# Patient Record
Sex: Female | Born: 1965 | Race: Black or African American | Hispanic: No | Marital: Married | Smoking: Current some day smoker
Health system: Southern US, Community
[De-identification: ages and names within clinical notes are randomized; demographics above are authoritative.]

## PROBLEM LIST (undated history)

## (undated) DIAGNOSIS — I10 Essential (primary) hypertension: Secondary | ICD-10-CM

## (undated) DIAGNOSIS — E785 Hyperlipidemia, unspecified: Secondary | ICD-10-CM

## (undated) DIAGNOSIS — F419 Anxiety disorder, unspecified: Secondary | ICD-10-CM

## (undated) DIAGNOSIS — J40 Bronchitis, not specified as acute or chronic: Secondary | ICD-10-CM

## (undated) DIAGNOSIS — F32A Depression, unspecified: Secondary | ICD-10-CM

## (undated) DIAGNOSIS — M1990 Unspecified osteoarthritis, unspecified site: Secondary | ICD-10-CM

## (undated) DIAGNOSIS — J302 Other seasonal allergic rhinitis: Secondary | ICD-10-CM

## (undated) HISTORY — DX: Depression, unspecified: F32.A

## (undated) HISTORY — PX: BREAST SURGERY: SHX581

## (undated) HISTORY — DX: Anxiety disorder, unspecified: F41.9

## (undated) HISTORY — DX: Other seasonal allergic rhinitis: J30.2

## (undated) HISTORY — DX: Hyperlipidemia, unspecified: E78.5

## (undated) HISTORY — PX: BREAST CYST EXCISION: SHX579

## (undated) HISTORY — DX: Unspecified osteoarthritis, unspecified site: M19.90

## (undated) HISTORY — PX: TUBAL LIGATION: SHX77

---

## 2008-06-30 ENCOUNTER — Emergency Department (HOSPITAL_COMMUNITY): Admission: EM | Admit: 2008-06-30 | Discharge: 2008-06-30 | Payer: Self-pay | Admitting: Emergency Medicine

## 2010-01-02 ENCOUNTER — Emergency Department (HOSPITAL_COMMUNITY)
Admission: EM | Admit: 2010-01-02 | Discharge: 2010-01-02 | Payer: Self-pay | Source: Home / Self Care | Admitting: Emergency Medicine

## 2010-06-19 LAB — POCT CARDIAC MARKERS
CKMB, poc: <1 ng/mL — ABNORMAL LOW (ref 1.0–8.0)
CKMB, poc: <1 ng/mL — ABNORMAL LOW (ref 1.0–8.0)
Myoglobin, poc: 47.3 ng/mL (ref 12–200)
Myoglobin, poc: 57.6 ng/mL (ref 12–200)
Troponin i, poc: <0.05 ng/mL (ref 0.00–0.09)

## 2010-06-19 LAB — POCT I-STAT, CHEM 8
BUN: 7 mg/dL (ref 6–23)
Chloride: 107 mEq/L (ref 96–112)
Creatinine, Ser: 0.7 mg/dL (ref 0.4–1.2)
Potassium: 3.6 mEq/L (ref 3.5–5.1)
Sodium: 137 mEq/L (ref 135–145)
TCO2: 21 mmol/L (ref 0–100)

## 2010-06-19 LAB — GC/CHLAMYDIA PROBE AMP, GENITAL: GC Probe Amp, Genital: NEGATIVE

## 2010-06-19 LAB — URINALYSIS, ROUTINE W REFLEX MICROSCOPIC
Glucose, UA: NEGATIVE mg/dL
Ketones, ur: 15 mg/dL — AB
Protein, ur: NEGATIVE mg/dL
Urobilinogen, UA: 1 mg/dL (ref 0.0–1.0)

## 2010-06-19 LAB — URINE CULTURE: Colony Count: >=100000

## 2010-06-19 LAB — POCT PREGNANCY, URINE: Preg Test, Ur: NEGATIVE

## 2010-06-19 LAB — URINE MICROSCOPIC-ADD ON

## 2010-06-19 LAB — WET PREP, GENITAL: Trich, Wet Prep: NONE SEEN

## 2010-09-09 ENCOUNTER — Emergency Department (HOSPITAL_COMMUNITY)
Admission: EM | Admit: 2010-09-09 | Discharge: 2010-09-09 | Disposition: A | Discharge status: Home / Self Care | Payer: Self-pay | Attending: Emergency Medicine | Admitting: Emergency Medicine

## 2010-09-09 ENCOUNTER — Emergency Department (HOSPITAL_COMMUNITY): Payer: Self-pay

## 2010-09-09 DIAGNOSIS — F17200 Nicotine dependence, unspecified, uncomplicated: Secondary | ICD-10-CM | POA: Insufficient documentation

## 2010-09-09 DIAGNOSIS — J45909 Unspecified asthma, uncomplicated: Secondary | ICD-10-CM | POA: Insufficient documentation

## 2010-09-09 DIAGNOSIS — R0789 Other chest pain: Secondary | ICD-10-CM | POA: Insufficient documentation

## 2010-12-04 ENCOUNTER — Emergency Department (HOSPITAL_COMMUNITY)
Admission: EM | Admit: 2010-12-04 | Discharge: 2010-12-04 | Disposition: A | Discharge status: Home / Self Care | Payer: Self-pay | Attending: Emergency Medicine | Admitting: Emergency Medicine

## 2010-12-04 DIAGNOSIS — K089 Disorder of teeth and supporting structures, unspecified: Secondary | ICD-10-CM | POA: Insufficient documentation

## 2010-12-04 DIAGNOSIS — J45909 Unspecified asthma, uncomplicated: Secondary | ICD-10-CM | POA: Insufficient documentation

## 2010-12-04 DIAGNOSIS — R221 Localized swelling, mass and lump, neck: Secondary | ICD-10-CM | POA: Insufficient documentation

## 2010-12-04 DIAGNOSIS — K047 Periapical abscess without sinus: Secondary | ICD-10-CM | POA: Insufficient documentation

## 2010-12-04 DIAGNOSIS — R599 Enlarged lymph nodes, unspecified: Secondary | ICD-10-CM | POA: Insufficient documentation

## 2010-12-04 DIAGNOSIS — R220 Localized swelling, mass and lump, head: Secondary | ICD-10-CM | POA: Insufficient documentation

## 2011-01-17 ENCOUNTER — Emergency Department (INDEPENDENT_AMBULATORY_CARE_PROVIDER_SITE_OTHER)
Admission: EM | Admit: 2011-01-17 | Discharge: 2011-01-17 | Disposition: A | Discharge status: Home / Self Care | Payer: Self-pay | Source: Home / Self Care

## 2011-01-17 ENCOUNTER — Encounter: Payer: Self-pay | Admitting: Emergency Medicine

## 2011-01-17 DIAGNOSIS — N644 Mastodynia: Secondary | ICD-10-CM

## 2011-01-17 DIAGNOSIS — N61 Mastitis without abscess: Secondary | ICD-10-CM

## 2011-01-17 MED ORDER — HYDROCODONE-ACETAMINOPHEN 5-325 MG PO TABS: 2.0000 | ORAL_TABLET | Freq: Four times a day (QID) | ORAL | Status: AC | PRN

## 2011-01-17 MED ORDER — IBUPROFEN 600 MG PO TABS: 600.0000 mg | ORAL_TABLET | Freq: Four times a day (QID) | ORAL | Status: AC | PRN

## 2011-01-17 MED ORDER — CEPHALEXIN 500 MG PO CAPS: 500.0000 mg | ORAL_CAPSULE | Freq: Four times a day (QID) | ORAL | Status: AC

## 2011-01-17 NOTE — ED Notes (Signed)
Pt here with c/o x 2 cysts to left breast with radiating pain under breast that has been ongoing since December 2011.pt was seen and diag [facility] but states sx resolved and restarted x 2wks ago.reports constant,achy pains.denies fever,chills,n,v

## 2011-01-17 NOTE — ED Provider Notes (Signed)
History     CSN: [id number] Arrival date & time: 01/17/2011  5:07 PM   First MD Initiated Contact with Patient 01/17/11 1718      Chief Complaint  Patient presents with  . Cyst    (Consider location/radiation/quality/duration/timing/severity/associated sxs/prior treatment) Patient is a 45 y.o. female presenting with chest pain. The history is provided by the patient. No language interpreter was used.  Chest Pain The chest pain began 1 - 2 weeks ago. Chest pain occurs intermittently. The chest pain is unchanged. The quality of the pain is described as similar to previous episodes. The pain does not radiate. Pertinent negatives for primary symptoms include no fever, no fatigue, no shortness of breath, no cough, no wheezing, no abdominal pain, no nausea and no vomiting. She tried NSAIDs and aspirin for the symptoms.   pt with sharp left breast pain lasting hours starting today. Similar sx over past 2 weeks but state they usually resolve on own. Reports redness, swelling at nipple earlier today. no nipple d/c, skin changes, fevers. No unintentional weight loss. No trauma to breast. Similar sx over past year, was dx'd with breast cysts last year on mammogram at breast center in [place] and was supposed to have them "drained" but was lost to follow up. No FH breast CA.   Past Medical History  Diagnosis Date  . Asthma     History reviewed. No pertinent past surgical history.  No family history on file.  History  Substance Use Topics  . Smoking status: Current Everyday Smoker  . Smokeless tobacco: Not on file  . Alcohol Use: Yes    OB History    Grav Para Term Preterm Abortions TAB SAB Ect Mult Living                  Review of Systems  Constitutional: Negative for fever, fatigue and unexpected weight change.  Respiratory: Negative for cough, shortness of breath and wheezing.   Gastrointestinal: Negative for nausea, vomiting and abdominal pain.    Allergies  Review of  patient's allergies indicates no known allergies.  Home Medications  No current outpatient prescriptions on file.  BP 164/79  Pulse 80  Temp(Src) 98.6 F (37 C) (Oral)  Resp 18  SpO2 100%  Physical Exam  Nursing note and vitals reviewed. Constitutional: She is oriented to person, place, and time. She appears well-developed and well-nourished. No distress.  HENT:  Head: Normocephalic and atraumatic.  Eyes: EOM are normal. Pupils are equal, round, and reactive to light.  Neck: Normal range of motion.  Cardiovascular: Regular rhythm.   Pulmonary/Chest: Effort normal and breath sounds normal. Left breast exhibits mass and tenderness. Left breast exhibits no inverted nipple, no nipple discharge and no skin change. Breasts are symmetrical.       Tenderness, Redness around nipple,. No expressable d/c. No skin changes. Small tender mobile mass in 3 oclock region near chest wall. No obvious swelling.   Abdominal: She exhibits no distension.  Musculoskeletal: Normal range of motion.  Lymphadenopathy:    She has no cervical adenopathy.    She has no axillary adenopathy.  Neurological: She is alert and oriented to person, place, and time.  Skin: Skin is warm and dry. No rash noted.  Psychiatric: She has a normal mood and affect. Her behavior is normal. Judgment and thought content normal.    ED Course  Procedures (including critical care time)  Labs Reviewed - No data to display No results found.   No diagnosis  found.    MDM  Appears to have early mastitis at nipple. Also with tender smooth mobile mass suggestive of cyst in are that pt states has had cyst before. Will do pain control, nsaids, warm compresses, keflex. Pt with established MD for breast care. Advised close follow up for re-imaging and possible biospy        Danella Maiers [facility] 01/17/11 [phone]

## 2011-08-06 ENCOUNTER — Emergency Department (HOSPITAL_COMMUNITY)
Admission: EM | Admit: 2011-08-06 | Discharge: 2011-08-06 | Disposition: A | Discharge status: Home / Self Care | Payer: Self-pay | Attending: Emergency Medicine | Admitting: Emergency Medicine

## 2011-08-06 ENCOUNTER — Encounter (HOSPITAL_COMMUNITY): Payer: Self-pay | Admitting: *Deleted

## 2011-08-06 DIAGNOSIS — K044 Acute apical periodontitis of pulpal origin: Secondary | ICD-10-CM | POA: Insufficient documentation

## 2011-08-06 DIAGNOSIS — J45909 Unspecified asthma, uncomplicated: Secondary | ICD-10-CM | POA: Insufficient documentation

## 2011-08-06 DIAGNOSIS — K047 Periapical abscess without sinus: Secondary | ICD-10-CM

## 2011-08-06 DIAGNOSIS — F17200 Nicotine dependence, unspecified, uncomplicated: Secondary | ICD-10-CM | POA: Insufficient documentation

## 2011-08-06 MED ORDER — PENICILLIN V POTASSIUM 500 MG PO TABS: 500.0000 mg | ORAL_TABLET | Freq: Four times a day (QID) | ORAL | Status: AC

## 2011-08-06 MED ORDER — HYDROCODONE-ACETAMINOPHEN 5-325 MG PO TABS: 1.0000 | ORAL_TABLET | ORAL | Status: AC | PRN

## 2011-08-06 NOTE — Discharge Instructions (Signed)
Dental Care and Dentist Visits   Dental care supports good overall health. Regular dental visits can also help you avoid dental pain, bleeding, infection, and other more serious health problems in the future. It is important to keep the mouth healthy because diseases in the teeth, gums, and other oral tissues can spread to other areas of the body. Some problems, such as diabetes, heart disease, and pre-term labor have been associated with poor oral health.   See your dentist every 6 months. If you experience emergency problems such as a toothache or broken tooth, go to the dentist right away. If you see your dentist regularly, you may catch problems early. It is easier to be treated for problems in the early stages.   WHAT TO EXPECT AT A DENTIST VISIT   Your dentist will look for many common oral health problems and recommend proper treatment. At your regular dental visit, you can expect:   Gentle cleaning of the teeth and gums. This includes scraping and polishing. This helps to remove the sticky substance around the teeth and gums (plaque). Plaque forms in the mouth shortly after eating. Over time, plaque hardens on the teeth as tartar. If tartar is not removed regularly, it can cause problems. Cleaning also helps remove stains.   Periodic X-rays. These pictures of the teeth and supporting bone will help your dentist assess the health of your teeth.   Periodic fluoride treatments. Fluoride is a natural mineral shown to help strengthen teeth. Fluoride treatment involves applying a fluoride gel or varnish to the teeth. It is most commonly done in children.   Examination of the mouth, tongue, jaws, teeth, and gums to look for any oral health problems, such as:   Cavities (dental caries). This is decay on the tooth caused by plaque, sugar, and acid in the mouth. It is best to catch a cavity when it is small.   Inflammation of the gums caused by plaque buildup (gingivitis).   Problems with the mouth or malformed or  misaligned teeth.   Oral cancer or other diseases of the soft tissues or jaws.   KEEP YOUR TEETH AND GUMS HEALTHY   For healthy teeth and gums, follow these general guidelines as well as your dentist's specific advice:   Have your teeth professionally cleaned at the dentist every 6 months.   Brush twice daily with a fluoride toothpaste.   Floss your teeth daily.   Ask your dentist if you need fluoride supplements, treatments, or fluoride toothpaste.   Eat a healthy diet. Reduce foods and drinks with added sugar.   Avoid smoking.  TREATMENT FOR ORAL HEALTH PROBLEMS   If you have oral health problems, treatment varies depending on the conditions present in your teeth and gums.   Your caregiver will most likely recommend good oral hygiene at each visit.   For cavities, gingivitis, or other oral health disease, your caregiver will perform a procedure to treat the problem. This is typically done at a separate appointment. Sometimes your caregiver will refer you to another dental specialist for specific tooth problems or for surgery.  SEEK IMMEDIATE DENTAL CARE IF:   You have pain, bleeding, or soreness in the gum, tooth, jaw, or mouth area.   A permanent tooth becomes loose or separated from the gum socket.   You experience a blow or injury to the mouth or jaw area.  Document Released: 11/06/2010 Document Revised: 02/13/2011 Document Reviewed: 11/06/2010   [employer] Patient Information 2012 [employer].

## 2011-08-06 NOTE — ED Provider Notes (Signed)
History   This chart was scribed for Flint Melter, MD by Brooks Sailors. The patient was seen in room STRE4/STRE4. Patient's care was started at 1547.   CSN: [id number]  Arrival date & time 08/06/11  1547   First MD Initiated Contact with Patient 08/06/11 1824      Chief Complaint  Patient presents with  . Abscess    (Consider location/radiation/quality/duration/timing/severity/associated sxs/prior treatment) HPI  Taylor Cook is a 46 y.o. female who presents to the Emergency Department complaining of a abscess on her left jaw, and jaw pain. Pt says she has an appointment with oral surgeon on Monday to get tooth pulled. Says the pain is a throbbing pain and has been present for four days. Cannot chew or eat on her left side. Pt with no other medical problems.    Past Medical History  Diagnosis Date  . Asthma     Past Surgical History  Procedure Date  . Breast surgery   . Tubal ligation     History reviewed. No pertinent family history.  History  Substance Use Topics  . Smoking status: Current Everyday Smoker  . Smokeless tobacco: Not on file  . Alcohol Use: Yes    OB History    Grav Para Term Preterm Abortions TAB SAB Ect Mult Living                  Review of Systems  All other systems reviewed and are negative.    Allergies  Review of patient's allergies indicates no known allergies.  Home Medications   Current Outpatient Rx  Name Route Sig Dispense Refill  . ASPIRIN-ACETAMINOPHEN-CAFFEINE 250-250-65 MG PO TABS Oral Take 1 tablet by mouth every 6 (six) hours as needed.    . BC HEADACHE POWDER PO Oral Take 1 packet by mouth daily as needed. For pain    . IBUPROFEN 200 MG PO TABS Oral Take 600 mg by mouth every 6 (six) hours as needed. For pain    . TETRAHYDROZOLINE HCL 0.05 % OP SOLN Both Eyes Place 1 drop into both eyes 3 (three) times daily.    Marland Kitchen HYDROCODONE-ACETAMINOPHEN 5-325 MG PO TABS Oral Take 1 tablet by mouth every 4 (four) hours as needed  for pain. 20 tablet 0  . PENICILLIN V POTASSIUM 500 MG PO TABS Oral Take 1 tablet (500 mg total) by mouth 4 (four) times daily. 40 tablet 0    BP 143/83  Pulse 80  Temp(Src) 98.7 F (37.1 C) (Oral)  Resp 18  SpO2 100%  Physical Exam  Nursing note and vitals reviewed. Constitutional: She is oriented to person, place, and time. She appears well-developed and well-nourished. No distress.  HENT:  Head: Normocephalic and atraumatic.       Teeth have no obvious abnormalities. Tender left inferior gutter without palpable abscess. TMJ function normal  Eyes: EOM are normal.  Neck: Neck supple. No tracheal deviation present.  Cardiovascular: Normal rate.   Pulmonary/Chest: Effort normal. No respiratory distress.  Abdominal: She exhibits no distension.  Musculoskeletal: Normal range of motion. She exhibits no edema.  Neurological: She is alert and oriented to person, place, and time. No sensory deficit.  Skin: Skin is warm and dry.  Psychiatric: She has a normal mood and affect. Her behavior is normal.    ED Course  Procedures (including critical care time)  1840 Pt seen and will be given pain medication.   Labs Reviewed - No data to display No results found.  1. Dental infection       MDM  Dental infection, without obvious abscess. Patient stable for discharge  Plan: Home Medications- Percocet, PCN; Home Treatments- soft foods; Recommended follow up- Oral Surg. As planned   I personally performed the services described in this documentation, which was scribed in my presence. The recorded information has been reviewed and considered.     Flint Melter, MD 08/06/11 2136

## 2011-08-06 NOTE — ED Notes (Signed)
Patient states she has two abscess's, one in her lower left side of her mouth ans the other along the left jaw line. Pain started x 4 days ago. Pain is worse today.

## 2011-08-06 NOTE — ED Notes (Signed)
To ED for eval of left lower jaw pain for past 4 days. Pt states she has an appt with oral surgeon on Monday. Pt has abscess at site of pain

## 2011-10-10 ENCOUNTER — Encounter (HOSPITAL_COMMUNITY): Payer: Self-pay | Admitting: Emergency Medicine

## 2011-10-10 ENCOUNTER — Emergency Department (HOSPITAL_COMMUNITY)
Admission: EM | Admit: 2011-10-10 | Discharge: 2011-10-10 | Disposition: A | Discharge status: Home / Self Care | Payer: Self-pay | Attending: Emergency Medicine | Admitting: Emergency Medicine

## 2011-10-10 DIAGNOSIS — J45909 Unspecified asthma, uncomplicated: Secondary | ICD-10-CM | POA: Insufficient documentation

## 2011-10-10 DIAGNOSIS — F17200 Nicotine dependence, unspecified, uncomplicated: Secondary | ICD-10-CM | POA: Insufficient documentation

## 2011-10-10 DIAGNOSIS — K0889 Other specified disorders of teeth and supporting structures: Secondary | ICD-10-CM

## 2011-10-10 DIAGNOSIS — K029 Dental caries, unspecified: Secondary | ICD-10-CM | POA: Insufficient documentation

## 2011-10-10 DIAGNOSIS — K047 Periapical abscess without sinus: Secondary | ICD-10-CM | POA: Insufficient documentation

## 2011-10-10 MED ORDER — CLINDAMYCIN HCL 150 MG PO CAPS: 150.0000 mg | ORAL_CAPSULE | Freq: Four times a day (QID) | ORAL | Status: AC

## 2011-10-10 MED ORDER — HYDROCODONE-ACETAMINOPHEN 5-325 MG PO TABS: 1.0000 | ORAL_TABLET | Freq: Four times a day (QID) | ORAL | Status: AC | PRN

## 2011-10-10 NOTE — ED Notes (Signed)
Pt given discharge and follow up instructions after speaking with provider. Denies further needs at this time. Ambulates to lobby in NAD  

## 2011-10-10 NOTE — ED Notes (Signed)
PT. REPORTS LEFT LOWER MOLAR PAIN WITH SWELLING ONSET YESTERDAY .

## 2011-10-10 NOTE — ED Provider Notes (Signed)
History     CSN: [id number]  Arrival date & time 10/10/11  1191   First MD Initiated Contact with Patient 10/10/11 [phone]      Chief Complaint  Patient presents with  . Dental Pain    (Consider location/radiation/quality/duration/timing/severity/associated sxs/prior treatment) HPI  46 year old female presents c/o dental pain.  Patient initially complaining of pain to the left lower premolar about a month and half ago. She was seen by Dr. Chales Salmon, a dentist and was given amoxicillin and pain medication at that time. Patient reports medication has helped with the symptoms. However, since last night she did begin to develop pain to the same tooth with associated swelling. Describe pain as a throbbing sensation that is constant and keeping her up at night. She endorses subjective fevers and chills. She denies ear pain, rash, throat irritation, or neck pain. She denies any recent trauma. She is scheduled to be seen by a dentist sometime next week, however she's unable to tolerate the pain.  Past Medical History  Diagnosis Date  . Asthma     Past Surgical History  Procedure Date  . Breast surgery   . Tubal ligation     No family history on file.  History  Substance Use Topics  . Smoking status: Current Everyday Smoker  . Smokeless tobacco: Not on file  . Alcohol Use: Yes    OB History    Grav Para Term Preterm Abortions TAB SAB Ect Mult Living                  Review of Systems  Constitutional: Positive for fever and chills.  HENT: Positive for dental problem. Negative for ear pain and neck pain.   Skin: Negative for rash and wound.    Allergies  Review of patient's allergies indicates no known allergies.  Home Medications   Current Outpatient Rx  Name Route Sig Dispense Refill  . ASPIRIN-ACETAMINOPHEN-CAFFEINE 250-250-65 MG PO TABS Oral Take 1 tablet by mouth every 6 (six) hours as needed.    . BC HEADACHE POWDER PO Oral Take 1 packet by mouth daily as needed. For  pain    . IBUPROFEN 200 MG PO TABS Oral Take 600 mg by mouth every 6 (six) hours as needed. For pain    . TETRAHYDROZOLINE HCL 0.05 % OP SOLN Both Eyes Place 1 drop into both eyes 3 (three) times daily.      BP 151/76  Pulse 91  Temp 98.2 F (36.8 C) (Oral)  Resp 20  SpO2 96%  LMP 09/30/2011  Physical Exam  Nursing note and vitals reviewed. Constitutional: She appears well-developed and well-nourished. No distress.  HENT:  Head: Normocephalic and atraumatic.  Right Ear: External ear normal.  Left Ear: External ear normal.  Nose: Nose normal.  Mouth/Throat: Oropharynx is clear and moist. No oropharyngeal exudate.    Eyes: Conjunctivae are normal.  Neck: Normal range of motion. Neck supple.  Musculoskeletal: She exhibits no edema.  Lymphadenopathy:    She has no cervical adenopathy.  Neurological: She is alert.  Skin: Skin is warm. No rash noted.  Psychiatric: She has a normal mood and affect.    ED Course  Procedures (including critical care time)  Labs Reviewed - No data to display No results found.   No diagnosis found.  1. Periapical abscess (L lower premolar)  MDM  Dental pain and facial swelling consistence with periapical abscess.  VSS, afebrile.  Will give clindamycin and pain medication.  Pt to  f/u with Dr. Chales Salmon at her next appointment for dental extraction        Fayrene Helper, PA-C 10/10/11 [phone]

## 2011-10-10 NOTE — ED Provider Notes (Signed)
Medical screening examination/treatment/procedure(s) were performed by non-physician practitioner and as supervising physician I was immediately available for consultation/collaboration.   Gerhard Munch, MD 10/10/11 [phone]

## 2011-11-10 ENCOUNTER — Encounter (HOSPITAL_COMMUNITY): Payer: Self-pay | Admitting: Family Medicine

## 2011-11-10 ENCOUNTER — Emergency Department (HOSPITAL_COMMUNITY): Payer: Self-pay

## 2011-11-10 ENCOUNTER — Emergency Department (HOSPITAL_COMMUNITY)
Admission: EM | Admit: 2011-11-10 | Discharge: 2011-11-11 | Disposition: A | Discharge status: Home / Self Care | Payer: Self-pay | Attending: Emergency Medicine | Admitting: Emergency Medicine

## 2011-11-10 DIAGNOSIS — R079 Chest pain, unspecified: Secondary | ICD-10-CM | POA: Insufficient documentation

## 2011-11-10 DIAGNOSIS — J45909 Unspecified asthma, uncomplicated: Secondary | ICD-10-CM | POA: Insufficient documentation

## 2011-11-10 DIAGNOSIS — F17200 Nicotine dependence, unspecified, uncomplicated: Secondary | ICD-10-CM | POA: Insufficient documentation

## 2011-11-10 LAB — CBC WITH DIFFERENTIAL/PLATELET
Basophils Relative: 1 % (ref 0–1)
Eosinophils Absolute: 0.1 10*3/uL (ref 0.0–0.7)
Eosinophils Relative: 2 % (ref 0–5)
Hemoglobin: 10.3 g/dL — ABNORMAL LOW (ref 12.0–15.0)
Lymphs Abs: 1.8 10*3/uL (ref 0.7–4.0)
MCH: 26 pg (ref 26.0–34.0)
MCHC: 32.3 g/dL (ref 30.0–36.0)
MCV: 80.6 fL (ref 78.0–100.0)
Monocytes Relative: 8 % (ref 3–12)
Platelets: 283 10*3/uL (ref 150–400)
RBC: 3.96 MIL/uL (ref 3.87–5.11)

## 2011-11-10 LAB — COMPREHENSIVE METABOLIC PANEL
Albumin: 4.1 g/dL (ref 3.5–5.2)
BUN: 11 mg/dL (ref 6–23)
Calcium: 9.6 mg/dL (ref 8.4–10.5)
Creatinine, Ser: 0.63 mg/dL (ref 0.50–1.10)
GFR calc Af Amer: >90 mL/min (ref >90)
Glucose, Bld: 91 mg/dL (ref 70–99)
Total Protein: 7.8 g/dL (ref 6.0–8.3)

## 2011-11-10 LAB — POCT I-STAT TROPONIN I: Troponin i, poc: 0 ng/mL (ref 0.00–0.08)

## 2011-11-10 MED ORDER — KETOROLAC TROMETHAMINE 30 MG/ML IJ SOLN
30.0000 mg | Freq: Once | INTRAMUSCULAR | Status: AC
  Administered 2011-11-10: 30 mg via INTRAVENOUS
  Filled 2011-11-10: qty 1

## 2011-11-10 MED ORDER — MORPHINE SULFATE 4 MG/ML IJ SOLN
6.0000 mg | Freq: Once | INTRAMUSCULAR | Status: AC
  Administered 2011-11-10: 6 mg via INTRAVENOUS
  Filled 2011-11-10: qty 2

## 2011-11-10 MED ORDER — SODIUM CHLORIDE 0.9 % IV SOLN
Freq: Once | INTRAVENOUS | Status: AC
  Administered 2011-11-10: 20 mL/h via INTRAVENOUS

## 2011-11-10 NOTE — ED Notes (Addendum)
Pt states she started to have chest pain this morning around 5a.m. Pt states pain is midsternal and radiating under her breast. Pt states her pain is at a level 7.pt states the pain hurts when she breaths and with movement.

## 2011-11-10 NOTE — ED Notes (Signed)
Pt states she is angry at waiting this long.  Her pain is the same as when she came in 4 hours ago.  Pt notified of lab results and told to notify me if her pain increases.

## 2011-11-10 NOTE — ED Notes (Signed)
Per pt woke up with left sided chest pain this am radiating into back with SOB. sts hurts more with breathing and movement. Denies cough, fever.

## 2011-11-11 MED ORDER — HYDROCODONE-ACETAMINOPHEN 5-325 MG PO TABS: 1.0000 | ORAL_TABLET | ORAL | Status: AC | PRN

## 2011-11-11 MED ORDER — IBUPROFEN 600 MG PO TABS: 600.0000 mg | ORAL_TABLET | Freq: Three times a day (TID) | ORAL | Status: AC | PRN

## 2011-11-11 MED ORDER — OXYCODONE-ACETAMINOPHEN 5-325 MG PO TABS
1.0000 | ORAL_TABLET | Freq: Once | ORAL | Status: AC
  Administered 2011-11-11: 1 via ORAL
  Filled 2011-11-11: qty 1

## 2011-11-11 NOTE — ED Provider Notes (Signed)
History     CSN: [id number]  Arrival date & time 11/10/11  1542   First MD Initiated Contact with Patient 11/10/11 2110      Chief Complaint  Patient presents with  . Chest Pain     The history is provided by the patient.   the patient reports she's had constant left-sided chest pain since waking up this morning.  Her pain is worse with movement and palpation and taking a deep breath.  She's had no recent long travel or surgery.  She denies a prior history of DVT or pulmonary embolism.  She's a history of asthma and smokes cigarettes.  She has no history of hypertension hyperlipidemia or diabetes.  She's no family history of early heart disease.  Her pain in her left chest but moderate in severity and constant.  She denies unilateral leg swelling.  She denies fevers or chills.  She's had no new productive cough.  Past Medical History  Diagnosis Date  . Asthma     Past Surgical History  Procedure Date  . Breast surgery   . Tubal ligation     No family history on file.  History  Substance Use Topics  . Smoking status: Current Everyday Smoker  . Smokeless tobacco: Not on file  . Alcohol Use: Yes    OB History    Grav Para Term Preterm Abortions TAB SAB Ect Mult Living                  Review of Systems  Cardiovascular: Positive for chest pain.  All other systems reviewed and are negative.    Allergies  Review of patient's allergies indicates no known allergies.  Home Medications  No current outpatient prescriptions on file.  BP 161/108  Pulse 67  Temp 98.1 F (36.7 C) (Oral)  Resp 17  SpO2 100%  LMP 11/08/2011  Physical Exam  Nursing note and vitals reviewed. Constitutional: She is oriented to person, place, and time. She appears well-developed and well-nourished. No distress.  HENT:  Head: Normocephalic and atraumatic.  Eyes: EOM are normal.  Neck: Normal range of motion.  Cardiovascular: Normal rate, regular rhythm and normal heart sounds.     Pulmonary/Chest: Effort normal and breath sounds normal.       Tenderness of left lateral and left anterior chest wall.  No rash noted.  No cellulitis noted.  No crepitus.  Abdominal: Soft. She exhibits no distension. There is no tenderness.  Musculoskeletal: Normal range of motion.  Neurological: She is alert and oriented to person, place, and time.  Skin: Skin is warm and dry.  Psychiatric: She has a normal mood and affect. Judgment normal.    ED Course  Procedures (including critical care time)   Date: 11/11/2011  Rate: 65  Rhythm: normal sinus rhythm  QRS Axis: normal  Intervals: normal  ST/T Wave abnormalities: normal  Conduction Disutrbances: none  Narrative Interpretation:   Old EKG Reviewed: No significant changes noted     Labs Reviewed  CBC WITH DIFFERENTIAL - Abnormal; Notable for the following:    Hemoglobin 10.3 (*)     HCT 31.9 (*)     RDW 16.9 (*)     All other components within normal limits  COMPREHENSIVE METABOLIC PANEL - Abnormal; Notable for the following:    Total Bilirubin 0.2 (*)     All other components within normal limits  POCT I-STAT TROPONIN I  TROPONIN I   Dg Chest 2 View  11/10/2011  *RADIOLOGY  REPORT*  Clinical Data: Chest pain  CHEST - 2 VIEW  Comparison: 09/09/2010  Findings: The heart size and mediastinal contours are within normal limits.  Both lungs are clear.  The visualized skeletal structures are unremarkable.  IMPRESSION: No active cardiopulmonary abnormalities.   Original Report Authenticated By: Rosealee Albee, M.D.     I personally reviewed the imaging tests through PACS system I reviewed available ER/hospitalization records thought the EMR   1. Chest pain       MDM  The patient's pain is worse with movement and palpation.  EKG x2 and cardiac enzymes x2 are normal.  I suspect this is chest wall pain.  The patient is feeling better at this time.  Discharge home in good condition.  She has no cardiac risk factors. PERC  negative        Lyanne Co, MD 11/11/11 [phone]

## 2011-12-19 ENCOUNTER — Encounter (HOSPITAL_COMMUNITY): Payer: Self-pay | Admitting: *Deleted

## 2011-12-19 ENCOUNTER — Emergency Department (HOSPITAL_COMMUNITY)
Admission: EM | Admit: 2011-12-19 | Discharge: 2011-12-19 | Disposition: A | Discharge status: Home / Self Care | Payer: Self-pay | Attending: Emergency Medicine | Admitting: Emergency Medicine

## 2011-12-19 ENCOUNTER — Emergency Department (HOSPITAL_COMMUNITY): Payer: Self-pay

## 2011-12-19 DIAGNOSIS — M722 Plantar fascial fibromatosis: Secondary | ICD-10-CM

## 2011-12-19 DIAGNOSIS — M25476 Effusion, unspecified foot: Secondary | ICD-10-CM | POA: Insufficient documentation

## 2011-12-19 DIAGNOSIS — F17200 Nicotine dependence, unspecified, uncomplicated: Secondary | ICD-10-CM | POA: Insufficient documentation

## 2011-12-19 DIAGNOSIS — M25473 Effusion, unspecified ankle: Secondary | ICD-10-CM | POA: Insufficient documentation

## 2011-12-19 HISTORY — DX: Bronchitis, not specified as acute or chronic: J40

## 2011-12-19 MED ORDER — HYDROCODONE-ACETAMINOPHEN 5-325 MG PO TABS: 1.0000 | ORAL_TABLET | Freq: Four times a day (QID) | ORAL | Status: DC | PRN

## 2011-12-19 MED ORDER — PREDNISONE 50 MG PO TABS: 50.0000 mg | ORAL_TABLET | Freq: Every day | ORAL | Status: DC

## 2011-12-19 NOTE — ED Provider Notes (Signed)
Medical screening examination/treatment/procedure(s) were performed by non-physician practitioner and as supervising physician I was immediately available for consultation/collaboration.   Gavin Pound. Oletta Lamas, MD 12/19/11 [phone]

## 2011-12-19 NOTE — ED Provider Notes (Signed)
History     CSN: [id number]  Arrival date & time 12/19/11  4098   First MD Initiated Contact with Patient 12/19/11 [phone]      Chief Complaint  Patient presents with  . Joint Swelling    (Consider location/radiation/quality/duration/timing/severity/associated sxs/prior treatment) HPI The patient presents to the emergency department with a 2 week history of right ankle swelling.  She denies injury or a previous history of injury to her right ankle.  The patient has been taking otc ibuprofen and using heating pads with little relief.  She states the pain is worse with walking.  She denies fever, erythema, rash, warmth, extremity weakness, and numbness.      Past Medical History  Diagnosis Date  . Asthma   . Bronchitis     Past Surgical History  Procedure Date  . Breast surgery   . Tubal ligation     History reviewed. No pertinent family history.  History  Substance Use Topics  . Smoking status: Current Every Day Smoker  . Smokeless tobacco: Not on file  . Alcohol Use: Yes    OB History    Grav Para Term Preterm Abortions TAB SAB Ect Mult Living                  Review of Systems All pertinent positives and negatives in the history of present illness   Allergies  Review of patient's allergies indicates no known allergies.  Home Medications  No current outpatient prescriptions on file.  BP 134/84  Pulse 101  Temp 97.9 F (36.6 C) (Oral)  Resp 18  SpO2 100%  Physical Exam  Constitutional: She is oriented to person, place, and time. She appears well-developed and well-nourished.  Cardiovascular: Normal rate, regular rhythm and intact distal pulses.   Pulmonary/Chest: Effort normal and breath sounds normal.  Musculoskeletal:       Right knee: Normal.       Right ankle: She exhibits swelling. She exhibits normal range of motion, no deformity and normal pulse. tenderness. Achilles tendon normal.       Left ankle: Normal.       Feet:  Neurological: She is  alert and oriented to person, place, and time. She displays normal reflexes. She exhibits normal muscle tone. Coordination normal.  Skin: Skin is warm and dry. No rash noted. No erythema. No pallor.    ED Course  Procedures (including critical care time)   The patient stands a lot at work. The patient had pain to palpation of the area of her plantar fascia. The patient has no injury. Return here as needed. She will be referred to ortho as well. Told to ice the area and she was instructed on stretching.   MDM          Carlyle Dolly, PA-C 12/19/11 [phone]

## 2011-12-19 NOTE — ED Notes (Signed)
Pt transported to xray 

## 2011-12-19 NOTE — ED Notes (Signed)
Pt states right ankle pain and swelling intermittantly for the past two weeks. Pain started without injury or known cause. Pt states heat and elevation relieve pain and swelling. Pt ambulatory.

## 2012-06-08 ENCOUNTER — Emergency Department (HOSPITAL_COMMUNITY)
Admission: EM | Admit: 2012-06-08 | Discharge: 2012-06-08 | Disposition: A | Discharge status: Home / Self Care | Payer: Self-pay | Attending: Emergency Medicine | Admitting: Emergency Medicine

## 2012-06-08 ENCOUNTER — Encounter (HOSPITAL_COMMUNITY): Payer: Self-pay | Admitting: Emergency Medicine

## 2012-06-08 DIAGNOSIS — S139XXA Sprain of joints and ligaments of unspecified parts of neck, initial encounter: Secondary | ICD-10-CM | POA: Insufficient documentation

## 2012-06-08 DIAGNOSIS — Y929 Unspecified place or not applicable: Secondary | ICD-10-CM | POA: Insufficient documentation

## 2012-06-08 DIAGNOSIS — Y939 Activity, unspecified: Secondary | ICD-10-CM | POA: Insufficient documentation

## 2012-06-08 DIAGNOSIS — S161XXA Strain of muscle, fascia and tendon at neck level, initial encounter: Secondary | ICD-10-CM

## 2012-06-08 DIAGNOSIS — F17200 Nicotine dependence, unspecified, uncomplicated: Secondary | ICD-10-CM | POA: Insufficient documentation

## 2012-06-08 DIAGNOSIS — X58XXXA Exposure to other specified factors, initial encounter: Secondary | ICD-10-CM | POA: Insufficient documentation

## 2012-06-08 DIAGNOSIS — Z8709 Personal history of other diseases of the respiratory system: Secondary | ICD-10-CM | POA: Insufficient documentation

## 2012-06-08 DIAGNOSIS — J3489 Other specified disorders of nose and nasal sinuses: Secondary | ICD-10-CM | POA: Insufficient documentation

## 2012-06-08 DIAGNOSIS — J45909 Unspecified asthma, uncomplicated: Secondary | ICD-10-CM | POA: Insufficient documentation

## 2012-06-08 MED ORDER — METHOCARBAMOL 500 MG PO TABS: 500.0000 mg | ORAL_TABLET | Freq: Two times a day (BID) | ORAL | Status: DC

## 2012-06-08 MED ORDER — NAPROXEN 500 MG PO TABS: 500.0000 mg | ORAL_TABLET | Freq: Two times a day (BID) | ORAL | Status: DC

## 2012-06-08 NOTE — ED Provider Notes (Signed)
History     CSN: [id number]  Arrival date & time 06/08/12  1012   First MD Initiated Contact with Patient 06/08/12 1034      Chief Complaint  Patient presents with  . Otalgia    (Consider location/radiation/quality/duration/timing/severity/associated sxs/prior treatment) HPI Comments: Patient presenting with a chief complaint of right ear pain and right sided neck pain.  Pain has been present for the past 2 days and is gradually worsening.  She denies any trauma or acute injury.  She denies fever or chills.  No drainage of the ear.  No erythema.  She reports that the pain of her neck is worse when she turns her head to the left.  She has taken Ibuprofen for the pain with some relief.  The history is provided by the patient.    Past Medical History  Diagnosis Date  . Asthma   . Bronchitis     Past Surgical History  Procedure Laterality Date  . Breast surgery    . Tubal ligation      History reviewed. No pertinent family history.  History  Substance Use Topics  . Smoking status: Current Every Day Smoker  . Smokeless tobacco: Not on file  . Alcohol Use: Yes    OB History   Grav Para Term Preterm Abortions TAB SAB Ect Mult Living                  Review of Systems  Constitutional: Negative for fever and chills.  HENT: Positive for ear pain, congestion and neck pain. Negative for hearing loss, sore throat, facial swelling, trouble swallowing, neck stiffness, sinus pressure, tinnitus and ear discharge.     Allergies  Review of patient's allergies indicates no known allergies.  Home Medications   Current Outpatient Rx  Name  Route  Sig  Dispense  Refill  . ibuprofen (ADVIL,MOTRIN) 200 MG tablet   Oral   Take 400 mg by mouth every 6 (six) hours as needed for pain.           BP 136/84  Pulse 88  Temp(Src) 98.2 F (36.8 C) (Oral)  Resp 18  SpO2 96%  Physical Exam  Nursing note and vitals reviewed. Constitutional: She appears well-developed and  well-nourished.  HENT:  Head: Normocephalic and atraumatic.  Right Ear: Hearing, tympanic membrane, external ear and ear canal normal. No mastoid tenderness.  Left Ear: Hearing, tympanic membrane, external ear and ear canal normal. No mastoid tenderness.  Mouth/Throat: Oropharynx is clear and moist.  Neck: Normal range of motion. Neck supple. Muscular tenderness present. No spinous process tenderness present. No edema and no erythema present.  Cardiovascular: Normal rate, regular rhythm and normal heart sounds.   Pulmonary/Chest: Effort normal and breath sounds normal.  Musculoskeletal:  Tenderness to palpation along the sternocleidomastoid muscle.  Pain worse when turning head to the left.  Lymphadenopathy:    She has no cervical adenopathy.  Neurological: She is alert.  Skin: Skin is warm and dry.  Psychiatric: She has a normal mood and affect.    ED Course  Procedures (including critical care time)  Labs Reviewed - No data to display No results found.   No diagnosis found.    MDM  Patient presenting with right sided neck pain that is radiating up to her right ear.  Pain located over the sternocleidomastoid muscle.  She does not have any mastoid tenderness or erythema.  No signs of ear infection.  Pain worsens when turning her head to the left.  Pain appears to be muscular. Patient discharged home with muscle relaxer and Naproxen.  Return precautions given.          Pascal Lux [place], PA-C 06/09/12 [phone]

## 2012-06-08 NOTE — ED Notes (Signed)
Pt c/o right sided ear and facial pain x 2 days; pt sts pain down into neck

## 2012-06-08 NOTE — ED Notes (Signed)
Pt reports pain in rt side of face and neck.  Pt denies injury. Pt states the neck looked swollen yesterday and was throbbing.  Pt does not have teeth on rt side so she does not believe it to be dental pain.  Pt states the pain started yesterday.  Pt also reports a sore in left nostril that bleed on and off.  Pt states sore has been there almost a year.  Pt alert oriented X4

## 2012-06-08 NOTE — Discharge Instructions (Signed)
Take muscle relaxer as prescribed.  Do not drive or operate heavy machinery while taking muscle relaxer.  Use Naproxen or Ibuprofen for pain.

## 2012-06-09 NOTE — ED Provider Notes (Signed)
Medical screening examination/treatment/procedure(s) were performed by non-physician practitioner and as supervising physician I was immediately available for consultation/collaboration.   Gavin Pound. Oletta Lamas, MD 06/09/12 2125

## 2012-08-23 ENCOUNTER — Encounter (HOSPITAL_COMMUNITY): Payer: Self-pay | Admitting: Emergency Medicine

## 2012-08-23 DIAGNOSIS — J45909 Unspecified asthma, uncomplicated: Secondary | ICD-10-CM | POA: Insufficient documentation

## 2012-08-23 DIAGNOSIS — R21 Rash and other nonspecific skin eruption: Secondary | ICD-10-CM | POA: Insufficient documentation

## 2012-08-23 DIAGNOSIS — F17200 Nicotine dependence, unspecified, uncomplicated: Secondary | ICD-10-CM | POA: Insufficient documentation

## 2012-08-23 DIAGNOSIS — Z8709 Personal history of other diseases of the respiratory system: Secondary | ICD-10-CM | POA: Insufficient documentation

## 2012-08-23 DIAGNOSIS — K047 Periapical abscess without sinus: Secondary | ICD-10-CM | POA: Insufficient documentation

## 2012-08-23 NOTE — ED Notes (Signed)
PT. REPORTS RIGHT AND LEFT LOWER MOLAR PAIN FOR SEVERAL DAYS UNRELIEVED BY OTC IBUPROFEN .

## 2012-08-24 ENCOUNTER — Emergency Department (HOSPITAL_COMMUNITY)
Admission: EM | Admit: 2012-08-24 | Discharge: 2012-08-24 | Disposition: A | Discharge status: Home / Self Care | Payer: Self-pay | Attending: Emergency Medicine | Admitting: Emergency Medicine

## 2012-08-24 DIAGNOSIS — R21 Rash and other nonspecific skin eruption: Secondary | ICD-10-CM

## 2012-08-24 DIAGNOSIS — K047 Periapical abscess without sinus: Secondary | ICD-10-CM

## 2012-08-24 DIAGNOSIS — K0889 Other specified disorders of teeth and supporting structures: Secondary | ICD-10-CM

## 2012-08-24 MED ORDER — OXYCODONE-ACETAMINOPHEN 5-325 MG PO TABS
2.0000 | ORAL_TABLET | Freq: Once | ORAL | Status: AC
  Administered 2012-08-24: 2 via ORAL
  Filled 2012-08-24: qty 2

## 2012-08-24 MED ORDER — HYDROCODONE-ACETAMINOPHEN 5-325 MG PO TABS: 1.0000 | ORAL_TABLET | ORAL | Status: DC | PRN

## 2012-08-24 MED ORDER — PENICILLIN V POTASSIUM 500 MG PO TABS: 500.0000 mg | ORAL_TABLET | Freq: Four times a day (QID) | ORAL | Status: AC

## 2012-08-24 NOTE — ED Provider Notes (Signed)
History     CSN: [id number]  Arrival date & time 08/23/12  2318   First MD Initiated Contact with Patient 08/24/12 0108      Chief Complaint  Patient presents with  . Dental Pain   HPI  History provided by the patient. Patient is a 47 year old female history of asthma bronchitis with complaints of multiple dental pains. Patient states she first began having pain in her right lower molar tooth. Pains then moved to her top right molar area and finally now over her left molar. They have been persistent for the past several days focused on the left first molar. She also feels some swelling into her cheek and face area. Denies any associated fever, chills or sweats. She reports a long standing history of problems with her teath including some broken teeth and holes in her teeth. Pains are worse with any pressure shooing on that side. She states she has only been able to have soft foods. She has been using ibuprofen which did help the first few days but has stopped working. She denies any other aggravating or alleviating factors. Denies any other associated symptoms. No difficulty swallowing or breathing. Patient does also mention having a pruritic rash primarily to her back and upper extremities as and present for the past several months. She has used hydrocortisone creams, [employer] itch creams without any improvement. No other family members have a similar rash. She denies having similar rash before.  No new soaps, lotions, body wash, clothing or detergents.   Past Medical History  Diagnosis Date  . Asthma   . Bronchitis     Past Surgical History  Procedure Laterality Date  . Breast surgery    . Tubal ligation      No family history on file.  History  Substance Use Topics  . Smoking status: Current Every Day Smoker  . Smokeless tobacco: Not on file  . Alcohol Use: Yes    OB History   Grav Para Term Preterm Abortions TAB SAB Ect Mult Living                  Review of Systems    Constitutional: Negative for fever, chills and diaphoresis.  All other systems reviewed and are negative.    Allergies  Review of patient's allergies indicates no known allergies.  Home Medications   Current Outpatient Rx  Name  Route  Sig  Dispense  Refill  . ibuprofen (ADVIL,MOTRIN) 200 MG tablet   Oral   Take 400 mg by mouth every 6 (six) hours as needed for pain.           BP 154/84  Pulse 88  Temp(Src) 99.6 F (37.6 C) (Oral)  Resp 14  SpO2 98%  LMP 07/25/2012  Physical Exam  Nursing note and vitals reviewed. Constitutional: She is oriented to person, place, and time. She appears well-developed and well-nourished. No distress.  HENT:  Head: Normocephalic.  Mouth/Throat:    There is dental caries throughout the teeth. There is significant pain to percussion over the left lower first molar there is slight fluctuance to it. No signs of drainable abscess. No swelling of the tongue. No concerning findings for Ludwig angina.  Neck: Neck supple.  Cardiovascular: Normal rate and regular rhythm.   Pulmonary/Chest: Effort normal and breath sounds normal. No stridor. No respiratory distress. She has no wheezes.  Abdominal: Soft.  Lymphadenopathy:    She has no cervical adenopathy.  Neurological: She is alert and oriented to person,  place, and time.  Skin: Skin is warm and dry.  Multiple dark hyperpigmented macular areas to the back with some secondary excoriations in the roofing. No bleeding or drainage. No swelling or raised lesions. No vesicles or pustules.  Psychiatric: She has a normal mood and affect. Her behavior is normal.    ED Course  Procedures       1. Periapical abscess   2. Pain, dental   3. Rash       MDM  Patient seen and evaluated. Patient appears uncomfortable holding right jaw. No acute distress.        Angus Seller, PA-C 08/24/12 2020

## 2012-08-25 NOTE — ED Provider Notes (Signed)
Medical screening examination/treatment/procedure(s) were performed by non-physician practitioner and as supervising physician I was immediately available for consultation/collaboration.  Sunnie Nielsen, MD 08/25/12 0002

## 2012-09-09 ENCOUNTER — Encounter (HOSPITAL_COMMUNITY): Payer: Self-pay | Admitting: *Deleted

## 2012-09-09 DIAGNOSIS — Z8709 Personal history of other diseases of the respiratory system: Secondary | ICD-10-CM | POA: Insufficient documentation

## 2012-09-09 DIAGNOSIS — Z79899 Other long term (current) drug therapy: Secondary | ICD-10-CM | POA: Insufficient documentation

## 2012-09-09 DIAGNOSIS — F17200 Nicotine dependence, unspecified, uncomplicated: Secondary | ICD-10-CM | POA: Insufficient documentation

## 2012-09-09 DIAGNOSIS — J45909 Unspecified asthma, uncomplicated: Secondary | ICD-10-CM | POA: Insufficient documentation

## 2012-09-09 DIAGNOSIS — K089 Disorder of teeth and supporting structures, unspecified: Secondary | ICD-10-CM | POA: Insufficient documentation

## 2012-09-09 NOTE — ED Notes (Signed)
The pt has had a toothache for 3 hours with a headache

## 2012-09-10 ENCOUNTER — Emergency Department (HOSPITAL_COMMUNITY)
Admission: EM | Admit: 2012-09-10 | Discharge: 2012-09-10 | Disposition: A | Discharge status: Home / Self Care | Payer: Self-pay | Attending: Emergency Medicine | Admitting: Emergency Medicine

## 2012-09-10 DIAGNOSIS — K0889 Other specified disorders of teeth and supporting structures: Secondary | ICD-10-CM

## 2012-09-10 MED ORDER — MORPHINE SULFATE 4 MG/ML IJ SOLN
4.0000 mg | Freq: Once | INTRAMUSCULAR | Status: AC
  Administered 2012-09-10: 4 mg via INTRAMUSCULAR
  Filled 2012-09-10: qty 1

## 2012-09-10 MED ORDER — AMOXICILLIN 500 MG PO CAPS: 500.0000 mg | ORAL_CAPSULE | Freq: Three times a day (TID) | ORAL | Status: DC

## 2012-09-10 MED ORDER — OXYCODONE-ACETAMINOPHEN 5-325 MG PO TABS: ORAL_TABLET | ORAL | Status: DC

## 2012-09-10 NOTE — ED Provider Notes (Signed)
Medical screening examination/treatment/procedure(s) were performed by non-physician practitioner and as supervising physician I was immediately available for consultation/collaboration.    Vida Roller, MD 09/10/12 [phone]

## 2012-09-10 NOTE — ED Provider Notes (Signed)
History    CSN: [id number] Arrival date & time 09/09/12  2337  First MD Initiated Contact with Patient 09/10/12 0044     Chief Complaint  Patient presents with  . Dental Pain   (Consider location/radiation/quality/duration/timing/severity/associated sxs/prior Treatment) HPI  Leisa Mayer is a 47 y.o. female complaining of right lower dental pain radiating to the temporal area worsening over the last 3 hours, patient has had this for 3 days but it has been much more severe today. She taken ibuprofen with no relief. Patient denies fever, nausea vomiting, difficulty handling her secretions. She rates her pain a 10 out of 10, no exacerbating or alleviating factors identified.  Past Medical History  Diagnosis Date  . Asthma   . Bronchitis    Past Surgical History  Procedure Laterality Date  . Breast surgery    . Tubal ligation     No family history on file. History  Substance Use Topics  . Smoking status: Current Every Day Smoker  . Smokeless tobacco: Not on file  . Alcohol Use: Yes   OB History   Grav Para Term Preterm Abortions TAB SAB Ect Mult Living                 Review of Systems  Constitutional: Negative for fever.  HENT: Positive for dental problem. Negative for ear pain, facial swelling, drooling, trouble swallowing, neck pain, neck stiffness and voice change.   Respiratory: Negative for shortness of breath.   Cardiovascular: Negative for chest pain.  Gastrointestinal: Negative for nausea, vomiting, abdominal pain and diarrhea.  All other systems reviewed and are negative.    Allergies  Review of patient's allergies indicates no known allergies.  Home Medications   Current Outpatient Rx  Name  Route  Sig  Dispense  Refill  . ibuprofen (ADVIL,MOTRIN) 200 MG tablet   Oral   Take 400 mg by mouth every 6 (six) hours as needed for pain.          BP 158/104  Pulse 111  Temp(Src) 98.3 F (36.8 C)  Resp 26  SpO2 98%  LMP 07/25/2012 Physical Exam   Nursing note and vitals reviewed. Constitutional: She is oriented to person, place, and time. She appears well-developed and well-nourished. No distress.  Patient is extremely agitated, crying, holding her head, hesitant to answer questions due to agitation.  HENT:  Head: Normocephalic. No trismus in the jaw.  Mouth/Throat: Uvula is midline and oropharynx is clear and moist. No edematous. No oropharyngeal exudate, posterior oropharyngeal edema, posterior oropharyngeal erythema or tonsillar abscesses.  Generally poor dentition, no gingival swelling, erythema or tenderness to palpation. Patient is handling their secretions. There is no tenderness to palpation or firmness underneath tongue bilaterally. No trismus.    Eyes: Conjunctivae and EOM are normal. Pupils are equal, round, and reactive to light.  Neck: Normal range of motion.  Cardiovascular: Normal rate.   Pulmonary/Chest: Effort normal. No stridor.  Musculoskeletal: Normal range of motion.  Lymphadenopathy:    She has no cervical adenopathy.  Neurological: She is alert and oriented to person, place, and time.  Psychiatric: She has a normal mood and affect.    ED Course  Procedures (including critical care time) Labs Reviewed - No data to display No results found. 1. Pain, dental     MDM   Filed Vitals:   09/09/12 2343  BP: 158/104  Pulse: 111  Temp: 98.3 F (36.8 C)  Resp: 26  SpO2: 98%     Gailya Sherilyn Cooter  is a 47 y.o. female Patient with toothache.  No gross abscess.  Exam unconcerning for Ludwig's angina or spread of infection.  Will treat with penicillin and pain medicine.  Urged patient to follow-up with dentist.     Medications  morphine 4 MG/ML injection 4 mg (not administered)    Pt is hemodynamically stable, appropriate for, and amenable to discharge at this time. Pt verbalized understanding and agrees with care plan. Outpatient follow-up and specific return precautions discussed.    New Prescriptions    AMOXICILLIN (AMOXIL) 500 MG CAPSULE    Take 1 capsule (500 mg total) by mouth 3 (three) times daily.   OXYCODONE-ACETAMINOPHEN (PERCOCET/ROXICET) 5-325 MG PER TABLET    1 to 2 tabs PO q6hrs  PRN for pain     Wynetta Emery, PA-C 09/10/12 0101

## 2013-01-11 ENCOUNTER — Emergency Department (HOSPITAL_COMMUNITY): Payer: Self-pay

## 2013-01-11 ENCOUNTER — Emergency Department (HOSPITAL_COMMUNITY)
Admission: EM | Admit: 2013-01-11 | Discharge: 2013-01-11 | Disposition: A | Discharge status: Home / Self Care | Payer: Self-pay | Attending: Emergency Medicine | Admitting: Emergency Medicine

## 2013-01-11 ENCOUNTER — Encounter (HOSPITAL_COMMUNITY): Payer: Self-pay | Admitting: Emergency Medicine

## 2013-01-11 DIAGNOSIS — M940 Chondrocostal junction syndrome [Tietze]: Secondary | ICD-10-CM | POA: Insufficient documentation

## 2013-01-11 DIAGNOSIS — F17200 Nicotine dependence, unspecified, uncomplicated: Secondary | ICD-10-CM | POA: Insufficient documentation

## 2013-01-11 DIAGNOSIS — J45909 Unspecified asthma, uncomplicated: Secondary | ICD-10-CM | POA: Insufficient documentation

## 2013-01-11 DIAGNOSIS — Z79899 Other long term (current) drug therapy: Secondary | ICD-10-CM | POA: Insufficient documentation

## 2013-01-11 MED ORDER — TRAMADOL HCL 50 MG PO TABS: 50.0000 mg | ORAL_TABLET | Freq: Four times a day (QID) | ORAL | Status: DC | PRN

## 2013-01-11 MED ORDER — NAPROXEN 500 MG PO TABS: 500.0000 mg | ORAL_TABLET | Freq: Two times a day (BID) | ORAL | Status: DC

## 2013-01-11 NOTE — ED Notes (Signed)
Patient with history of left breast cysts.  She states that she now continues with cysts that was said to have gone away and her breast has increased in size.  She also has a lump on her chest that she would like to get checked.  She states that it is tender to the touch.

## 2013-01-12 NOTE — ED Provider Notes (Signed)
CSN: [id number]     Arrival date & time 01/11/13  2030 History   First MD Initiated Contact with Patient 01/11/13 2137     Chief Complaint  Patient presents with  . Breast Mass   (Consider location/radiation/quality/duration/timing/severity/associated sxs/prior Treatment) HPI Taylor Cook is a 47 y.o. female who presents to emergency department complaining of pain to the anterior chest. States has history of breast cysts and thinks this may be the same. States chest is tender to palpation. Denies any injury. Denies any fever, chills, cough, pain radiation. She denies any shortness of breath. Did not take any medications for this. She does not have a primary care Dr.  Past Medical History  Diagnosis Date  . Asthma   . Bronchitis    Past Surgical History  Procedure Laterality Date  . Breast surgery    . Tubal ligation     History reviewed. No pertinent family history. History  Substance Use Topics  . Smoking status: Current Every Day Smoker  . Smokeless tobacco: Not on file  . Alcohol Use: No   OB History   Grav Para Term Preterm Abortions TAB SAB Ect Mult Living                 Review of Systems  Constitutional: Negative for fever and chills.  Respiratory: Negative for cough, chest tightness and shortness of breath.   Cardiovascular: Positive for chest pain. Negative for palpitations and leg swelling.  Gastrointestinal: Negative for nausea, vomiting, abdominal pain and diarrhea.  Genitourinary: Negative for dysuria, flank pain, vaginal bleeding, vaginal discharge, vaginal pain and pelvic pain.  Musculoskeletal: Negative for arthralgias and myalgias.  Skin: Negative for color change and rash.  Neurological: Negative for dizziness, weakness and headaches.  All other systems reviewed and are negative.    Allergies  Review of patient's allergies indicates no known allergies.  Home Medications   Current Outpatient Rx  Name  Route  Sig  Dispense  Refill  . naproxen  (NAPROSYN) 500 MG tablet   Oral   Take 1 tablet (500 mg total) by mouth 2 (two) times daily.   30 tablet   0   . traMADol (ULTRAM) 50 MG tablet   Oral   Take 1 tablet (50 mg total) by mouth every 6 (six) hours as needed.   15 tablet   0    BP 143/82  Pulse 82  Temp(Src) 98.4 F (36.9 C) (Oral)  Resp 18  Wt 146 lb 1.6 oz (66.271 kg)  SpO2 100%  LMP 12/21/2012 Physical Exam  Nursing note and vitals reviewed. Constitutional: She is oriented to person, place, and time. She appears well-developed and well-nourished. No distress.  HENT:  Head: Normocephalic.  Eyes: Conjunctivae are normal.  Neck: Neck supple.  Cardiovascular: Normal rate, regular rhythm and normal heart sounds.   Pulmonary/Chest: Effort normal and breath sounds normal. No respiratory distress. She has no wheezes. She has no rales. She exhibits tenderness.  Tenderness over left upper costosternal joints in the area of the ribs 3 and 4. Tender to palpation. No swelling, erythema, warmth to the touch. Normal left breast  Musculoskeletal: She exhibits no edema.  Neurological: She is alert and oriented to person, place, and time.  Skin: Skin is warm and dry.  Psychiatric: She has a normal mood and affect. Her behavior is normal.    ED Course  Procedures (including critical care time) Labs Review Labs Reviewed - No data to display Imaging Review Dg Chest 2 View  01/11/2013   CLINICAL DATA:  Knot on the upper left chest over the past 2 days. Chest pain.  EXAM: CHEST  2 VIEW  COMPARISON:  CHEST x-ray 11/10/2011.  FINDINGS: Lung volumes are normal. No consolidative airspace disease. No pleural effusions. No pneumothorax. No pulmonary nodule or mass noted. Pulmonary vasculature and the cardiomediastinal silhouette are within normal limits. Visualized bony thorax is grossly intact.  IMPRESSION: 1.  No radiographic evidence of acute cardiopulmonary disease.   Electronically Signed   By: Trudie Reed M.D.   On: 01/11/2013  21:32    EKG Interpretation   None       MDM   1. Costochondritis    Patient with tenderness over her sternocostal joints. Chest x-ray negative. It does not sound like cardiac pain or a PE. Her vital signs are normal. I suspect costochondritis. No signs of soft tissue or joint infection. Will start on NSAIDs and tramadol for pain. Followup with primary care Dr.  Ceasar Mons Vitals:   01/11/13 2044  BP: 143/82  Pulse: 82  Temp: 98.4 F (36.9 C)  TempSrc: Oral  Resp: 18  Weight: 146 lb 1.6 oz (66.271 kg)  SpO2: 100%      Yony Roulston A Garner [place]ulle[place], PA-C 01/12/13 0126

## 2013-01-15 NOTE — ED Provider Notes (Signed)
Medical screening examination/treatment/procedure(s) were performed by non-physician practitioner and as supervising physician I was immediately available for consultation/collaboration.  EKG Interpretation   None        Zareya Tuckett, MD 01/15/13 1633 

## 2013-12-10 ENCOUNTER — Emergency Department (HOSPITAL_COMMUNITY): Payer: Self-pay

## 2013-12-10 ENCOUNTER — Encounter (HOSPITAL_COMMUNITY): Payer: Self-pay | Admitting: Emergency Medicine

## 2013-12-10 ENCOUNTER — Emergency Department (HOSPITAL_COMMUNITY)
Admission: EM | Admit: 2013-12-10 | Discharge: 2013-12-10 | Disposition: A | Discharge status: Home / Self Care | Payer: Self-pay | Attending: Emergency Medicine | Admitting: Emergency Medicine

## 2013-12-10 DIAGNOSIS — Z3202 Encounter for pregnancy test, result negative: Secondary | ICD-10-CM | POA: Insufficient documentation

## 2013-12-10 DIAGNOSIS — Z720 Tobacco use: Secondary | ICD-10-CM | POA: Insufficient documentation

## 2013-12-10 DIAGNOSIS — J45901 Unspecified asthma with (acute) exacerbation: Secondary | ICD-10-CM | POA: Insufficient documentation

## 2013-12-10 DIAGNOSIS — R079 Chest pain, unspecified: Secondary | ICD-10-CM

## 2013-12-10 DIAGNOSIS — M940 Chondrocostal junction syndrome [Tietze]: Secondary | ICD-10-CM | POA: Insufficient documentation

## 2013-12-10 LAB — CBC WITH DIFFERENTIAL/PLATELET
Basophils Absolute: 0 10*3/uL (ref 0.0–0.1)
Basophils Relative: 0 % (ref 0–1)
Eosinophils Absolute: 0.1 10*3/uL (ref 0.0–0.7)
Eosinophils Relative: 1 % (ref 0–5)
HCT: 33.7 % — ABNORMAL LOW (ref 36.0–46.0)
HEMOGLOBIN: 11.2 g/dL — AB (ref 12.0–15.0)
LYMPHS ABS: 1.6 10*3/uL (ref 0.7–4.0)
LYMPHS PCT: 35 % (ref 12–46)
MCH: 25.7 pg — ABNORMAL LOW (ref 26.0–34.0)
MCHC: 33.2 g/dL (ref 30.0–36.0)
MCV: 77.5 fL — ABNORMAL LOW (ref 78.0–100.0)
MONOS PCT: 10 % (ref 3–12)
Monocytes Absolute: 0.5 10*3/uL (ref 0.1–1.0)
NEUTROS ABS: 2.5 10*3/uL (ref 1.7–7.7)
NEUTROS PCT: 54 % (ref 43–77)
Platelets: 304 10*3/uL (ref 150–400)
RBC: 4.35 MIL/uL (ref 3.87–5.11)
RDW: 17.4 % — ABNORMAL HIGH (ref 11.5–15.5)
WBC: 4.6 10*3/uL (ref 4.0–10.5)

## 2013-12-10 LAB — BASIC METABOLIC PANEL
Anion gap: 12 (ref 5–15)
BUN: 11 mg/dL (ref 6–23)
CHLORIDE: 103 meq/L (ref 96–112)
CO2: 21 meq/L (ref 19–32)
Calcium: 9.2 mg/dL (ref 8.4–10.5)
Creatinine, Ser: 0.67 mg/dL (ref 0.50–1.10)
GFR calc non Af Amer: >90 mL/min (ref >90)
GLUCOSE: 90 mg/dL (ref 70–99)
POTASSIUM: 3.8 meq/L (ref 3.7–5.3)
Sodium: 136 mEq/L — ABNORMAL LOW (ref 137–147)

## 2013-12-10 LAB — POC URINE PREG, ED: PREG TEST UR: NEGATIVE

## 2013-12-10 LAB — I-STAT TROPONIN, ED: Troponin i, poc: 0 ng/mL (ref 0.00–0.08)

## 2013-12-10 LAB — D-DIMER, QUANTITATIVE: D-Dimer, Quant: 0.51 ug/mL-FEU — ABNORMAL HIGH (ref 0.00–0.48)

## 2013-12-10 LAB — TROPONIN I: Troponin I: <0.3 ng/mL (ref <0.30)

## 2013-12-10 MED ORDER — SODIUM CHLORIDE 0.9 % IV BOLUS (SEPSIS)
1000.0000 mL | Freq: Once | INTRAVENOUS | Status: AC
  Administered 2013-12-10: 1000 mL via INTRAVENOUS

## 2013-12-10 MED ORDER — IOHEXOL 350 MG/ML SOLN
100.0000 mL | Freq: Once | INTRAVENOUS | Status: AC | PRN
  Administered 2013-12-10: 100 mL via INTRAVENOUS

## 2013-12-10 MED ORDER — ASPIRIN 325 MG PO TABS
325.0000 mg | ORAL_TABLET | Freq: Once | ORAL | Status: AC
  Administered 2013-12-10: 325 mg via ORAL
  Filled 2013-12-10: qty 1

## 2013-12-10 NOTE — ED Provider Notes (Signed)
CSN: [id number]     Arrival date & time 12/10/13  1502 History   First MD Initiated Contact with Patient 12/10/13 1632     Chief Complaint  Patient presents with  . Chest Pain  . Shortness of Breath     (Consider location/radiation/quality/duration/timing/severity/associated sxs/prior Treatment) The history is provided by the patient. No language interpreter was used.  Taylor Cook is a 48 y/o F with PMHx of asthma and bronchitis presenting to the ED with chest pain that started this morning when the patient got out of bed. Stated that the pain is localized to the left side of chest with radiation to the left breast and around to the left shoulder - denied radiation down the left arm. Stated that the pain is an electrical shock - sharp pain that lasts for approximately 45 minutes. Stated that she has associated shortness of breath with the chest pain. Stated that she has no more pain, but stated that she has tenderness upon palpation to her chest wall. Stated that she been dealing with this similar chest pain for the past 2 years - stated that the pain comes randomly and intermittently - stated that every time she gets this chest pain it is the same presentation, left side of the chest, sharp and lasts anywhere from 30-45 minutes. Patient stated that she had some nasal congestion and sore throat yesterday that has resolved with over the counter medications. Stated that she smokes approximately 3 cigarettes per day. Denied travel, leg swelling, difficulty breathing, dizziness, blurred vision, sudden loss of vision, nausea, vomiting, diarrhea, neck pain, fever, cough. Denied birth control. PCP none  Past Medical History  Diagnosis Date  . Asthma   . Bronchitis    Past Surgical History  Procedure Laterality Date  . Breast surgery    . Tubal ligation     No family history on file. History  Substance Use Topics  . Smoking status: Current Every Day Smoker    Types: Cigarettes  . Smokeless  tobacco: Not on file  . Alcohol Use: No   OB History   Grav Para Term Preterm Abortions TAB SAB Ect Mult Living                 Review of Systems  Constitutional: Negative for fever and chills.  Respiratory: Positive for cough and shortness of breath. Negative for chest tightness.   Cardiovascular: Positive for chest pain. Negative for leg swelling.  Gastrointestinal: Negative for nausea, vomiting and diarrhea.  Musculoskeletal: Negative for neck pain.  Neurological: Negative for dizziness, weakness and headaches.      Allergies  Review of patient's allergies indicates no known allergies.  Home Medications   Prior to Admission medications   Not on File   BP 155/77  Pulse 68  Temp(Src) 98.6 F (37 C) (Oral)  Resp 20  SpO2 100%  LMP 11/25/2013 Physical Exam  Nursing note and vitals reviewed. Constitutional: She is oriented to person, place, and time. She appears well-developed and well-nourished. No distress.  HENT:  Head: Normocephalic and atraumatic.  Mouth/Throat: Oropharynx is clear and moist. No oropharyngeal exudate.  Eyes: Conjunctivae and EOM are normal. Pupils are equal, round, and reactive to light. Right eye exhibits no discharge. Left eye exhibits no discharge.  Neck: Normal range of motion. Neck supple. No tracheal deviation present.  Cardiovascular: Normal rate, regular rhythm and normal heart sounds.   No murmur heard. Pulses:      Radial pulses are 2+ on the right side,  and 2+ on the left side.       Dorsalis pedis pulses are 2+ on the right side, and 2+ on the left side.  Cap refill < 3 seconds Negative swelling or pitting edema identified to lower extremities bilaterally  Pulmonary/Chest: Effort normal and breath sounds normal. No respiratory distress. She has no wheezes. She has no rales. She exhibits tenderness.    Patient is able to speak in full sentences without difficulty Negative use of accessory muscles Negative stridor  Discomfort upon  palpation to the center and left-sided chest-pain is reproducible upon palpation. Negative crepitus upon palpation. Negative depressions. Negative tenting the clavicles noted.  Musculoskeletal: Normal range of motion. She exhibits no edema and no tenderness.  Full ROM to upper and lower extremities without difficulty noted, negative ataxia noted.  Lymphadenopathy:    She has no cervical adenopathy.  Neurological: She is alert and oriented to person, place, and time. No cranial nerve deficit. She exhibits normal muscle tone. Coordination normal.  Cranial nerves III-XII grossly intact Strength 5+/5+ to upper and lower extremities bilaterally with resistance applied, equal distribution noted Equal grip strength bilaterally Negative facial droop Negative slurred speech Negative aphasia Patient responds to questions appropriately Patient follows commands well Negative arm drift  Skin: Skin is warm and dry. No rash noted. She is not diaphoretic. No erythema.  Psychiatric: She has a normal mood and affect. Her behavior is normal. Thought content normal.    ED Course  Procedures (including critical care time)  Results for orders placed during the hospital encounter of 12/10/13  CBC WITH DIFFERENTIAL      Result Value Ref Range   WBC 4.6  4.0 - 10.5 K/uL   RBC 4.35  3.87 - 5.11 MIL/uL   Hemoglobin 11.2 (*) 12.0 - 15.0 g/dL   HCT 33.7 (*) 36.0 - 46.0 %   MCV 77.5 (*) 78.0 - 100.0 fL   MCH 25.7 (*) 26.0 - 34.0 pg   MCHC 33.2  30.0 - 36.0 g/dL   RDW 17.4 (*) 11.5 - 15.5 %   Platelets 304  150 - 400 K/uL   Neutrophils Relative % 54  43 - 77 %   Neutro Abs 2.5  1.7 - 7.7 K/uL   Lymphocytes Relative 35  12 - 46 %   Lymphs Abs 1.6  0.7 - 4.0 K/uL   Monocytes Relative 10  3 - 12 %   Monocytes Absolute 0.5  0.1 - 1.0 K/uL   Eosinophils Relative 1  0 - 5 %   Eosinophils Absolute 0.1  0.0 - 0.7 K/uL   Basophils Relative 0  0 - 1 %   Basophils Absolute 0.0  0.0 - 0.1 K/uL  BASIC METABOLIC  PANEL      Result Value Ref Range   Sodium 136 (*) 137 - 147 mEq/L   Potassium 3.8  3.7 - 5.3 mEq/L   Chloride 103  96 - 112 mEq/L   CO2 21  19 - 32 mEq/L   Glucose, Bld 90  70 - 99 mg/dL   BUN 11  6 - 23 mg/dL   Creatinine, Ser 0.67  0.50 - 1.10 mg/dL   Calcium 9.2  8.4 - 10.5 mg/dL   GFR calc non Af Amer >90  >90 mL/min   GFR calc Af Amer >90  >90 mL/min   Anion gap 12  5 - 15  D-DIMER, QUANTITATIVE      Result Value Ref Range   D-Dimer, Quant 0.51 (*)  0.00 - 0.48 ug/mL-FEU  TROPONIN I      Result Value Ref Range   Troponin I <0.30  <0.30 ng/mL  I-STAT TROPOININ, ED      Result Value Ref Range   Troponin i, poc 0.00  0.00 - 0.08 ng/mL   Comment 3           POC URINE PREG, ED      Result Value Ref Range   Preg Test, Ur NEGATIVE  NEGATIVE    Labs Review Labs Reviewed  CBC WITH DIFFERENTIAL - Abnormal; Notable for the following:    Hemoglobin 11.2 (*)    HCT 33.7 (*)    MCV 77.5 (*)    MCH 25.7 (*)    RDW 17.4 (*)    All other components within normal limits  BASIC METABOLIC PANEL - Abnormal; Notable for the following:    Sodium 136 (*)    All other components within normal limits  D-DIMER, QUANTITATIVE - Abnormal; Notable for the following:    D-Dimer, Quant 0.51 (*)    All other components within normal limits  TROPONIN I  I-STAT TROPOININ, ED  POC URINE PREG, ED    Imaging Review Dg Chest 2 View  12/10/2013   CLINICAL DATA:  Shortness of breath with chest pain radiating down left arm; acute onset; initial encounter  EXAM: CHEST  2 VIEW  COMPARISON:  PA and lateral chest x-ray of January 11, 2013.  FINDINGS: The lungs are well-expanded and clear. The heart and pulmonary vascularity are normal. There is no pleural effusion or pneumothorax. The mediastinum is normal in width. The bony thorax is unremarkable.  IMPRESSION: There is no acute cardiopulmonary abnormality.   Electronically Signed   By: David  Martinique   On: 12/10/2013 17:45   Ct Angio Chest Pe W/cm &/or Wo  Cm  12/10/2013   CLINICAL DATA:  Left-sided chest pain acute with radiating to left axilla with onset this afternoon. Shortness of breath. Elevated D-dimer.  EXAM: CT ANGIOGRAPHY CHEST WITH CONTRAST  TECHNIQUE: Multidetector CT imaging of the chest was performed using the standard protocol during bolus administration of intravenous contrast. Multiplanar CT image reconstructions and MIPs were obtained to evaluate the vascular anatomy.  CONTRAST:  164mL OMNIPAQUE IOHEXOL 350 MG/ML SOLN  COMPARISON:  Chest x-ray earlier today.  FINDINGS: Lungs are adequately inflated without focal consolidation or effusion. There is minimal linear density over the lingula and right middle lobe compatible with scarring/atelectasis. There is minimal nodular opacification along the inferior surface of the minor fissure is well as adjacent the posterior surface of the major fissures.  There is borderline cardiomegaly. There is no evidence of pulmonary embolism. Thoracic aorta is within normal. There is evidence of mild mediastinal and bilateral hilar adenopathy with a 2.1 cm subcarinal lymph node and 1.5 cm AP window lymph node. Remaining mediastinal structures are unremarkable.  Images through the upper abdomen are unremarkable. Bones soft tissues are within normal.  Review of the MIP images confirms the above findings.  IMPRESSION: No acute cardiopulmonary disease and no evidence of pulmonary embolism.  Mild mediastinal and bilateral hilar adenopathy. 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 Lymphoma and metastatic disease are much less likely.  Subtle nodular opacification along the inferior surface of the right minor fissure and posterior surface of the major fissures likely benign. Recommend follow-up noncontrast chest CT 3 months.   Electronically Signed   By: Marin Olp M.D.   On: 12/10/2013 19:34  EKG Interpretation   Date/Time:  Saturday December 10 2013 15:05:17 EDT Ventricular Rate:  89 PR Interval:  144 QRS  Duration: 88 QT Interval:  374 QTC Calculation: 455 R Axis:   1 Text Interpretation:  Normal sinus rhythm Normal ECG Confirmed by  ZACKOWSKI  MD, SCOTT ([id number]) on 12/10/2013 6:24:38 PM      MDM   Final diagnoses:  Chest pain, unspecified chest pain type  Costochondritis    Medications  aspirin tablet 325 mg (325 mg Oral Given 12/10/13 1745)  sodium chloride 0.9 % bolus 1,000 mL (1,000 mLs Intravenous New Bag/Given 12/10/13 1752)  iohexol (OMNIPAQUE) 350 MG/ML injection 100 mL (100 mLs Intravenous Contrast Given 12/10/13 1912)   Filed Vitals:   12/10/13 1632 12/10/13 1830 12/10/13 1900 12/10/13 1926  BP: 166/78 155/86 163/83 155/77  Pulse: 69 67 65 68  Temp:      TempSrc:      Resp: 18 22 26 20   SpO2: 100% 100% 100% 100%   EKG noted normal sinus rhythm with a heart rate of 89 beats per minute. I-STAT troponin negative elevation. Second troponin negative elevation. D-dimer elevated at 0.51. CBC negative elevated WBC noted. Patient appears to be chronically anemic. BMP unremarkable. Urine pregnancy negative. Chest x-ray negative for acute cardiopulmonary disease. CT angiogram of chest no acute cardiopulmonary disease or evidence of pulmonary embolism noted-mild mediastinal and bilateral hilar adenopathy noted that are likely to to sarcoidosis. Subtle nodular opacity along the inferior surface the right minor fissure and posterior surface are likely benign-CT chest repeat within 3 months recommended. Negative findings of PE. Doubt ACS. Negative findings of fluid overload. Pain reproducible upon palpation to the chest wall. Patient has been having the same discomfort for the past 2 years-no new symptoms. Suspicion to be costochondritis. Discussed case with attending physician, Dr. Gloris Manchester who agrees to plan discharge. Patient stable, afebrile. Patient not septic appearing. Negative signs of respiratory distress. Patient has been pain free while in the ED setting. Discharged patient.  Discussed with patient to rest and stay hydrated. [facility]. Discussed with patient finding of nodule on CT and recommended repeat CT scan of the chest to be performed in 3 months - as per recommendations. Discussed with patient to closely monitor symptoms and if symptoms are to worsen or change to report back to the ED - strict return instructions given.  Patient agreed to plan of care, understood, all questions answered.   Jamse Mead, PA-C 12/10/13 2106

## 2013-12-10 NOTE — ED Notes (Signed)
Pt presents to department for evaluation of L sided chest pain radiating under L arm. Onset this afternoon. 6/10 pain at the time. Respirations unlabored. Speaking complete sentences. Pt is alert and oriented x4.

## 2013-12-10 NOTE — ED Notes (Signed)
Pt back from x-ray.

## 2013-12-10 NOTE — Discharge Instructions (Signed)
Please call your doctor for a followup appointment within 24-48 hours. When you talk to your doctor please let them know that you were seen in the emergency department and have them acquire all of your records so that they can discuss the findings with you and formulate a treatment plan to fully care for your new and ongoing problems. Please call and set-up an appointment with Health and [place] Please rest and stay hydrated Please decrease intake of cigarettes for this can lead to lung cancer Please have repeat CT chest performed within 3 month for monitoring of nodules Please continue to monitor symptoms closely and if symptoms are to worsen or change (fever greater than 101, chills, sweating, nausea, vomiting, chest pain, shortness of breathe, difficulty breathing, weakness, numbness, tingling, worsening or changes to pain pattern, coughing up blood, dizziness, headache, visual changes) please report back to the Emergency Department immediately.    Chest Pain (Nonspecific) It is often hard to give a specific diagnosis for the cause of chest pain. There is always a chance that your pain could be related to something serious, such as a heart attack or a blood clot in the lungs. You need to follow up with your health care provider for further evaluation. CAUSES   Heartburn.  Pneumonia or bronchitis.  Anxiety or stress.  Inflammation around your heart (pericarditis) or lung (pleuritis or pleurisy).  A blood clot in the lung.  A collapsed lung (pneumothorax). It can develop suddenly on its own (spontaneous pneumothorax) or from trauma to the chest.  Shingles infection (herpes zoster virus). The chest wall is composed of bones, muscles, and cartilage. Any of these can be the source of the pain.  The bones can be bruised by injury.  The muscles or cartilage can be strained by coughing or overwork.  The cartilage can be affected by inflammation and become sore  (costochondritis). DIAGNOSIS  Lab tests or other studies may be needed to find the cause of your pain. Your health care provider may have you take a test called an ambulatory electrocardiogram (ECG). An ECG records your heartbeat patterns over a 24-hour period. You may also have other tests, such as:  Transthoracic echocardiogram (TTE). During echocardiography, sound waves are used to evaluate how blood flows through your heart.  Transesophageal echocardiogram (TEE).  Cardiac monitoring. This allows your health care provider to monitor your heart rate and rhythm in real time.  Holter monitor. This is a portable device that records your heartbeat and can help diagnose heart arrhythmias. It allows your health care provider to track your heart activity for several days, if needed.  Stress tests by exercise or by giving medicine that makes the heart beat faster. TREATMENT   Treatment depends on what may be causing your chest pain. Treatment may include:  Acid blockers for heartburn.  Anti-inflammatory medicine.  Pain medicine for inflammatory conditions.  Antibiotics if an infection is present.  You may be advised to change lifestyle habits. This includes stopping smoking and avoiding alcohol, caffeine, and chocolate.  You may be advised to keep your head raised (elevated) when sleeping. This reduces the chance of acid going backward from your stomach into your esophagus. Most of the time, nonspecific chest pain will improve within 2-3 days with rest and mild pain medicine.  HOME CARE INSTRUCTIONS   If antibiotics were prescribed, take them as directed. Finish them even if you start to feel better.  For the next few days, avoid physical activities that bring on chest  pain. Continue physical activities as directed.  Do not use any tobacco products, including cigarettes, chewing tobacco, or electronic cigarettes.  Avoid drinking alcohol.  Only take medicine as directed by your health  care provider.  Follow your health care provider's suggestions for further testing if your chest pain does not go away.  Keep any follow-up appointments you made. If you do not go to an appointment, you could develop lasting (chronic) problems with pain. If there is any problem keeping an appointment, call to reschedule. SEEK MEDICAL CARE IF:   Your chest pain does not go away, even after treatment.  You have a rash with blisters on your chest.  You have a fever. SEEK IMMEDIATE MEDICAL CARE IF:   You have increased chest pain or pain that spreads to your arm, neck, jaw, back, or abdomen.  You have shortness of breath.  You have an increasing cough, or you cough up blood.  You have severe back or abdominal pain.  You feel nauseous or vomit.  You have severe weakness.  You faint.  You have chills. This is an emergency. Do not wait to see if the pain will go away. Get medical help at once. Call your local emergency services (911 in U.[place] Do not drive yourself to the hospital. MAKE SURE YOU:   Understand these instructions.  Will watch your condition.  Will get help right away if you are not doing well or get worse. Document Released: 12/04/2004 Document Revised: 03/01/2013 Document Reviewed: 09/30/2007 [facility] Patient Information 2015 [place], [place]. This information is not intended to replace advice given to you by your health care provider. Make sure you discuss any questions you have with your health care provider.  Costochondritis Costochondritis, sometimes called Tietze syndrome, is a swelling and irritation (inflammation) of the tissue (cartilage) that connects your ribs with your breastbone (sternum). It causes pain in the chest and rib area. Costochondritis usually goes away on its own over time. It can take up to 6 weeks or longer to get better, especially if you are unable to limit your activities. CAUSES  Some cases of costochondritis have no known cause.  Possible causes include:  Injury (trauma).  Exercise or activity such as lifting.  Severe coughing. SIGNS AND SYMPTOMS  Pain and tenderness in the chest and rib area.  Pain that gets worse when coughing or taking deep breaths.  Pain that gets worse with specific movements. DIAGNOSIS  Your health care provider will do a physical exam and ask about your symptoms. Chest X-rays or other tests may be done to rule out other problems. TREATMENT  Costochondritis usually goes away on its own over time. Your health care provider may prescribe medicine to help relieve pain. HOME CARE INSTRUCTIONS   Avoid exhausting physical activity. Try not to strain your ribs during normal activity. This would include any activities using chest, abdominal, and side muscles, especially if heavy weights are used.  Apply ice to the affected area for the first 2 days after the pain begins.  Put ice in a plastic bag.  Place a towel between your skin and the bag.  Leave the ice on for 20 minutes, 2-3 times a day.  Only take over-the-counter or prescription medicines as directed by your health care provider. SEEK MEDICAL CARE IF:  You have redness or swelling at the rib joints. These are signs of infection.  Your pain does not go away despite rest or medicine. SEEK IMMEDIATE MEDICAL CARE IF:   Your pain  increases or you are very uncomfortable.  You have shortness of breath or difficulty breathing.  You cough up blood.  You have worse chest pains, sweating, or vomiting.  You have a fever or persistent symptoms for more than 2-3 days.  You have a fever and your symptoms suddenly get worse. MAKE SURE YOU:   Understand these instructions.  Will watch your condition.  Will get help right away if you are not doing well or get worse. Document Released: 12/04/2004 Document Revised: 12/15/2012 Document Reviewed: 09/28/2012 [facility] Patient Information 2015 [place], [place]. This information is not  intended to replace advice given to you by your health care provider. Make sure you discuss any questions you have with your health care provider.     Emergency Department Resource Guide 1) Find a Doctor and Pay Out of Pocket Although you won't have to find out who is covered by your insurance plan, it is a good idea to ask around and get recommendations. You will then need to call the office and see if the doctor you have chosen will accept you as a new patient and what types of options they offer for patients who are self-pay. Some doctors offer discounts or will set up payment plans for their patients who do not have insurance, but you will need to ask so you aren't surprised when you get to your appointment.  2) Contact Your Local Health Department Not all health departments have doctors that can see patients for sick visits, but many do, so it is worth a call to see if yours does. If you don't know where your local health department is, you can check in your phone book. The CDC also has a tool to help you locate your state's health department, and many state websites also have listings of all of their local health departments.  3) Find a [place] [facility] If your illness is not likely to be very severe or complicated, you may want to try a walk in clinic. These are popping up all over the country in pharmacies, drugstores, and shopping centers. They're usually staffed by nurse practitioners or physician assistants that have been trained to treat common illnesses and complaints. They're usually fairly quick and inexpensive. However, if you have serious medical issues or chronic medical problems, these are probably not your best option.  No Primary Care Doctor: - Call Health Connect at  [phone] - they can help you locate a primary care doctor that  accepts your insurance, provides certain services, etc. - Physician Referral Service- [phone]  Chronic Pain Problems: Organization          Address  Phone   Notes  [facility]  [phone] Patients need to be referred by their primary care doctor.   Medication Assistance: Organization         Address  Phone   Notes  [facility] Medication [facility] [place]., [place], [place] [address] [phone] --Must be a resident of [facility] -- Must have NO insurance coverage whatsoever (no Medicaid/ Medicare, etc.) -- The pt. MUST have a primary care doctor that directs their care regularly and follows them in the community   Med[facility]ist  [phone]   [employer]  [phone]    Agencies that provide inexpensive medical care: Organization         Address  Phone   Notes  [facility]  [phone]   [facility]    (  [phone]   [facility] [place], [place] [address] [phone]   [place] [address], [place] [phone]   Planned Parenthood    [phone]   [facility]    [phone]   [place] and [address], [place] Phone:  [phone], Fax:  [phone] Hours of Operation:  9 am - 6 pm, M-F.  Also accepts Medicaid/Medicare and self-pay.  [facility] for [place] [address] [place], [address], [place] Phone: [phone], Fax: [phone]. Hours of Operation:  8:30 am - 5:30 pm, M-F.  Also accepts Medicaid and self-pay.  [facility] [place] [address], [place] Phone: [phone]   [place], [place], [place] [phone], Ext. [phone] Mondays & Thursdays: 7-9 AM.  First 15 patients are seen on a first come, first serve basis.    [place] Providers:  Organization         Address  Phone   Notes  [facility] [address], [place] [phone] Also accepts self-pay patients.  [facility] [address], [place]  [phone]   [place], [address], [place] [phone]   [facility] [address] [place] [phone]   Lucianne Lei [address], [place]   [phone] Only accepts [place] Access [place] patients after they have their name applied to their card.   Self-Pay (no insurance) in [facility]:  Organization         Address  Phone   Notes  Sickle Cell Patients, [facility] [place] [phone]   [facility] [place] [phone]   [facility] [place]  [address], [place], [place] [phone]   [facility]/Dr. Osei-Bonsu  [address], [place] or [address], [place] [phone] Phone number for both [place] and [place] locations is the same.  Urgent Medical and [facility] [address], [place] [phone]   [facility] [address], [place] or [address] [phone]   [facility] [address] [place] [phone], phone; [phone], fax Sees patients 1st and 3rd Saturday of every month.  Must not qualify for public or private insurance (i.e. Medicaid, Medicare, [place] Health Choice, Veterans' Benefits)  Household income should be no more than 200% of the poverty level The clinic cannot treat you if you are pregnant or think you are pregnant  Sexually transmitted diseases are not treated at the clinic.    Dental Care: Organization         Address  Phone  Notes  [facility] Department of [facility] [place] [phone] Accepts children up to age 52 who are enrolled in [place] or [place]; pregnant women with a Medicaid card; and  children who have applied for Medicaid or [place] Health Choice, but were declined, whose parents can pay a reduced fee at time of service.  [facility] Department of [facility]  [address], [place] [phone] Accepts children up  to age 24 who are enrolled in Medicaid or [place] Health Choice; pregnant women with a Medicaid card; and children who have applied for Medicaid or  Health Choice, but were declined, whose parents can pay a reduced fee at time of service.  [facility] Adult [facility] Access PROGRAM  [place] [phone] Patients are seen by appointment only. Walk-ins are not accepted. [place] will see patients 65 years of age and older. Monday - Tuesday (8am-5pm) Most Wednesdays (8:30-5pm) $30 per visit, cash only  [facility] Adult Dental Access PROGRAM  [address], [facility] [phone] Patients are seen by appointment only. Walk-ins are not accepted. [place] will see patients 31 years of age and older. One Wednesday Evening (Monthly: Volunteer Based).  $30 per visit, cash only  [place]  [phone] for adults; Children under age 30, call Graduate Pediatric Dentistry at [phone]. Children aged 66-14, please call [phone] to request a pediatric application.  Dental services are provided in all areas of dental care including fillings, crowns and bridges, complete and partial dentures, implants, gum treatment, root canals, and extractions. Preventive care is also provided. Treatment is provided to both adults and children. Patients are selected via a lottery and there is often a waiting list.   [facility] [address] [place]  [phone] [email or web address]   [facility] [address] [place], [place] [phone], Ext. [phone] Second and Fourth Thursday of each month, opens at 6:30 AM; Clinic ends at 9 AM.  Patients are seen on a first-come first-served  basis, and a limited number are seen during each clinic.   [facility]  [address] Hillard Danker [place], [place] [phone]   Eligibility Requirements You must have lived in [place], [place], or [place] counties for at least the last three months.   You cannot be eligible for state or federal sponsored [employer], including [employer], [place], or [employer].   You generally cannot be eligible for healthcare insurance through your employer.    How to apply: Eligibility screenings are held every Tuesday and Wednesday afternoon from 1:00 pm until 4:00 pm. You do not need an appointment for the interview!  [facility] [address] [place], [place]   [place]  [place] Department  [place]  [phone]    Behavioral Health Resources in the Community: Intensive Outpatient Programs Organization         Address  Phone  Notes  [place]. [address] [place], [place] [phone]   [facility] [address] [place], [place]   ADS: Alcohol & Drug [facility] [address] [place], [place]   [place] [address],  [place], [place] or [phone]   Substance Abuse Resources Organization         Address  Phone  Notes  Alcohol and Drug Services  [phone]   [place]  [phone]   [facility] [place]   Chinita Pester  [phone]   Residential & Outpatient Substance Abuse Program  [phone]   Psychological Services Organization         Address  Phone  Notes  [place]  [place]  [place]   [place] [phone]  N[address] [place] or [phone]    Mobile Crisis Teams Organization          Address  Phone  Notes  [facility], Mobile Crisis Care Unit  [phone]   [facility]ive Psychotherapeutic Services  [address] [place], [place]   [place] [address], [place] [phone]    Self-Help/Support Groups Organization         Address  Phone             Notes  [place]. of [place] - variety of support groups  [place] Call for more information  Narcotics Anonymous (NA), [facility] [address], [employer] [place]  2 meetings at this location   Special educational needs teacher         Address  Phone  Notes  ASAP Residential Treatment [place],    [place]  [phone]   [facility]  [address] [place] [address]68, [place], [place]   [place], [place] [phone] Admissions: 8am-3pm M-F  Incentives Substance [place] [address]    [place], [place] [phone]   [facility] [address] [place], [place], [place]   [facility] [address],  [place], [place]   [facility] - Intensive Outpatient [address] Kristeen [address], [place], [place]   [facility] ([place].,  [place], [place] [phone] or [phone]   [facility] Services (RTS) [address], [place], [place] Accepts Medicaid  [facility] [place] [address],  [place] [phone] Substance Abuse/Addiction Treatment   [facility] Organization         Address  Phone  Notes  [facility]  [phone]   Domenic Schwab, PhD [address] Arlis Porta [place], [place]   [phone] or [phone]   [facility] [place], [place] [phone]   [facility] [address] [place], [place] [phone] Insurance/Medicaid/sponsorship  through [facility] Families [address]                                    [place], [place] [phone] [place] [address], [place] [phone]    Dr. Adele Schilder  [phone]   [facility] [place] Dept. 1) [address], [place] 2) [place] 3)  [place] [address], [place] [phone]  [phone]   [place] [phone] or [phone] (After Hours)

## 2013-12-10 NOTE — ED Notes (Signed)
Pt A&Ox4, ambulatory at d/c with steady gait, NAD 

## 2013-12-11 NOTE — ED Provider Notes (Signed)
Medical screening examination/treatment/procedure(s) were performed by non-physician practitioner and as supervising physician I was immediately available for consultation/collaboration.   EKG Interpretation   Date/Time:  Saturday December 10 2013 15:05:17 EDT Ventricular Rate:  89 PR Interval:  144 QRS Duration: 88 QT Interval:  374 QTC Calculation: 455 R Axis:   1 Text Interpretation:  Normal sinus rhythm Normal ECG Confirmed by  Rogene Houston  MD, Murle Otting ([id number]) on 12/10/2013 6:24:38 PM        [name], MD 12/11/13 1359

## 2013-12-21 ENCOUNTER — Ambulatory Visit: Payer: Self-pay | Admitting: Family Medicine

## 2014-01-12 ENCOUNTER — Ambulatory Visit (INDEPENDENT_AMBULATORY_CARE_PROVIDER_SITE_OTHER): Payer: Self-pay | Admitting: Cardiology

## 2014-01-12 ENCOUNTER — Encounter: Payer: Self-pay | Admitting: *Deleted

## 2014-01-12 ENCOUNTER — Encounter: Payer: Self-pay | Admitting: Cardiology

## 2014-01-12 VITALS — BP 126/80 | HR 78 | Ht 64.0 in | Wt 145.0 lb

## 2014-01-12 DIAGNOSIS — R072 Precordial pain: Secondary | ICD-10-CM

## 2014-01-12 DIAGNOSIS — Z720 Tobacco use: Secondary | ICD-10-CM

## 2014-01-12 DIAGNOSIS — J45909 Unspecified asthma, uncomplicated: Secondary | ICD-10-CM | POA: Insufficient documentation

## 2014-01-12 DIAGNOSIS — J449 Chronic obstructive pulmonary disease, unspecified: Secondary | ICD-10-CM | POA: Insufficient documentation

## 2014-01-12 DIAGNOSIS — R079 Chest pain, unspecified: Secondary | ICD-10-CM | POA: Insufficient documentation

## 2014-01-12 DIAGNOSIS — J4520 Mild intermittent asthma, uncomplicated: Secondary | ICD-10-CM

## 2014-01-12 DIAGNOSIS — Z8249 Family history of ischemic heart disease and other diseases of the circulatory system: Secondary | ICD-10-CM | POA: Insufficient documentation

## 2014-01-12 DIAGNOSIS — J410 Simple chronic bronchitis: Secondary | ICD-10-CM

## 2014-01-12 DIAGNOSIS — F17200 Nicotine dependence, unspecified, uncomplicated: Secondary | ICD-10-CM

## 2014-01-12 LAB — LIPID PANEL
Cholesterol: 173 mg/dL (ref 0–200)
HDL: 63.2 mg/dL (ref >39.00)
LDL Cholesterol: 97 mg/dL (ref 0–99)
NonHDL: 109.8
Total CHOL/HDL Ratio: 3
Triglycerides: 64 mg/dL (ref 0.0–149.0)
VLDL: 12.8 mg/dL (ref 0.0–40.0)

## 2014-01-12 NOTE — Progress Notes (Signed)
Patient ID: Taylor Cook, female   DOB: 09-30-1965, 48 y.o.   MRN: [id number]     Patient Name: Taylor Cook Date of Encounter: 01/12/2014  Primary Care Provider:  No PCP Per Patient Primary Cardiologist:  Dorothy Spark   Problem List   Past Medical History  Diagnosis Date  . Asthma   . Bronchitis   . Seasonal allergies    Past Surgical History  Procedure Laterality Date  . Breast surgery      removal of 2 cyst  . Tubal ligation      Allergies  No Known Allergies  HPI  A 48 y/o F with PMHx of asthma and COPD, ongoing smoker who presented to the ER on 01/10/2014 with chest pain that started after she woke up and started to walk around her apartment. The patient states that her pain was left-sided felt like a tightness and was radiating along her left side of the chest to her back. She has been experiencing this type of pains for last 2 years. Sometimes they're related to exertion and resolve at rest but other times they can be when she is just sitting at rest. They can last up to 45 minutes but on occasion states that last almost all day and then slowly resolve and related at rest. The patient is an ongoing lifetime smoker. She states that her father died after he had a stroke. She states that 2 of her sisters had stents placed to her coronaries bowls of them in 22s one of them is only 48 year old right now. She denies any orthopnea, paroxysmal nocturnal dyspnea, palpitations or syncope she also denies lower extremity edema.   Home Medications  Prior to Admission medications   Medication Sig Start Date End Date Taking? Authorizing Provider  aspirin 81 MG tablet Take 81 mg by mouth daily.   Yes Historical Provider, MD    Family History  Family History  Problem Relation Age of Onset  . Stroke      fx  . Heart attack      fx  . Heart disease      fx  . Hypertension      fx  . Cancer      fx    Social History  History   Social History  . Marital Status:  Married    Spouse Name: N/A    Number of Children: N/A  . Years of Education: N/A   Occupational History  . Not on file.   Social History Main Topics  . Smoking status: Current Some Day Smoker    Types: Cigarettes  . Smokeless tobacco: Not on file  . Alcohol Use: No  . Drug Use: No  . Sexual Activity: Not on file   Other Topics Concern  . Not on file   Social History Narrative     Review of Systems, as per HPI, otherwise negative General:  No chills, fever, night sweats or weight changes.  Cardiovascular:  No chest pain, dyspnea on exertion, edema, orthopnea, palpitations, paroxysmal nocturnal dyspnea. Dermatological: No rash, lesions/masses Respiratory: No cough, dyspnea Urologic: No hematuria, dysuria Abdominal:   No nausea, vomiting, diarrhea, bright red blood per rectum, melena, or hematemesis Neurologic:  No visual changes, wkns, changes in mental status. All other systems reviewed and are otherwise negative except as noted above.  Physical Exam  Blood pressure 126/80, pulse 78, height 5\' 4"  (1.626 m), weight 145 lb (65.772 kg), SpO2 98 %.  General: Pleasant, NAD Psych: Normal affect.  Neuro: Alert and oriented X 3. Moves all extremities spontaneously. HEENT: Normal  Neck: Supple without bruits or JVD. Lungs:  Resp regular and unlabored, CTA. Heart: RRR no s3, s4, or murmurs. Abdomen: Soft, non-tender, non-distended, BS + x 4.  Extremities: No clubbing, cyanosis or edema. DP/PT/Radials 2+ and equal bilaterally.  Labs:  No results for input(s): CKTOTAL, CKMB, TROPONINI in the last 72 hours. Lab Results  Component Value Date   WBC 4.6 12/10/2013   HGB 11.2* 12/10/2013   HCT 33.7* 12/10/2013   MCV 77.5* 12/10/2013   PLT 304 12/10/2013    Lab Results  Component Value Date   DDIMER 0.51* 12/10/2013   Invalid input(s): POCBNP    Component Value Date/Time   NA 136* 12/10/2013 1525   K 3.8 12/10/2013 1525   CL 103 12/10/2013 1525   CO2 21 12/10/2013 1525     GLUCOSE 90 12/10/2013 1525   BUN 11 12/10/2013 1525   CREATININE 0.67 12/10/2013 1525   CALCIUM 9.2 12/10/2013 1525   PROT 7.8 11/10/2011 1558   ALBUMIN 4.1 11/10/2011 1558   AST 23 11/10/2011 1558   ALT 14 11/10/2011 1558   ALKPHOS 105 11/10/2011 1558   BILITOT 0.2* 11/10/2011 1558   GFRNONAA >90 12/10/2013 1525   GFRAA >90 12/10/2013 1525   No results found for: CHOL, HDL, LDLCALC, TRIG  Accessory Clinical Findings  Echocardiogram - none  ECG - sinus rhythm, normal EKG    Assessment & Plan  48 year old female  1. Chest pain - with some typical and some atypical features, however significant risk factors that include significant family history of premature coronary artery disease in 2 of her sister, ongoing smoking. We will order an exercise nuclear stress test to rule out ischemia.  2. Lipids - not not known we will check today.  3. Smoking - counseling provided. Patient doesn't seem to be motivated to stop smoking.  4. Asthma, COPD - patient is not actively wheezing.  Follow up in 1 month.   Dorothy Spark, MD, [facility] 01/12/2014, 8:31 AM

## 2014-01-12 NOTE — Patient Instructions (Signed)
Your physician recommends that you continue on your current medications as directed. Please refer to the Current Medication list given to you today.   Your physician recommends that you return for lab work in: TODAY- ([place])   Your physician has requested that you have en exercise stress myoview. For further information please visit [email or web address]. Please follow instruction sheet, as given.   Your physician recommends that you schedule a follow-up appointment in: WITH DR Edgerton

## 2014-01-18 ENCOUNTER — Encounter (HOSPITAL_COMMUNITY): Payer: Self-pay

## 2014-01-18 ENCOUNTER — Ambulatory Visit (HOSPITAL_COMMUNITY): Payer: Self-pay

## 2014-01-18 NOTE — Progress Notes (Signed)
Patient ID: Taylor Cook, female   DOB: 1966/03/10, 48 y.o.   MRN: [id number] Patient cancelled Wille Glaser due to insurance issues. Patient will check on insurance and call back to reschedule per Elliot Gault, RN.

## 2014-02-16 ENCOUNTER — Ambulatory Visit: Payer: Self-pay | Admitting: Cardiology

## 2014-02-24 ENCOUNTER — Encounter: Payer: Self-pay | Admitting: Cardiology

## 2014-03-13 ENCOUNTER — Encounter (HOSPITAL_COMMUNITY): Payer: Self-pay | Admitting: Cardiology

## 2014-03-13 ENCOUNTER — Emergency Department (HOSPITAL_COMMUNITY): Payer: Self-pay

## 2014-03-13 ENCOUNTER — Emergency Department (HOSPITAL_COMMUNITY)
Admission: EM | Admit: 2014-03-13 | Discharge: 2014-03-13 | Disposition: A | Discharge status: Home / Self Care | Payer: Self-pay | Attending: Emergency Medicine | Admitting: Emergency Medicine

## 2014-03-13 DIAGNOSIS — Z720 Tobacco use: Secondary | ICD-10-CM | POA: Insufficient documentation

## 2014-03-13 DIAGNOSIS — X58XXXA Exposure to other specified factors, initial encounter: Secondary | ICD-10-CM | POA: Insufficient documentation

## 2014-03-13 DIAGNOSIS — Y9289 Other specified places as the place of occurrence of the external cause: Secondary | ICD-10-CM | POA: Insufficient documentation

## 2014-03-13 DIAGNOSIS — Y998 Other external cause status: Secondary | ICD-10-CM | POA: Insufficient documentation

## 2014-03-13 DIAGNOSIS — R079 Chest pain, unspecified: Secondary | ICD-10-CM

## 2014-03-13 DIAGNOSIS — Z7982 Long term (current) use of aspirin: Secondary | ICD-10-CM | POA: Insufficient documentation

## 2014-03-13 DIAGNOSIS — Y9389 Activity, other specified: Secondary | ICD-10-CM | POA: Insufficient documentation

## 2014-03-13 DIAGNOSIS — D649 Anemia, unspecified: Secondary | ICD-10-CM | POA: Insufficient documentation

## 2014-03-13 DIAGNOSIS — S20212A Contusion of left front wall of thorax, initial encounter: Secondary | ICD-10-CM | POA: Insufficient documentation

## 2014-03-13 DIAGNOSIS — Z791 Long term (current) use of non-steroidal anti-inflammatories (NSAID): Secondary | ICD-10-CM | POA: Insufficient documentation

## 2014-03-13 DIAGNOSIS — R0789 Other chest pain: Secondary | ICD-10-CM

## 2014-03-13 DIAGNOSIS — M940 Chondrocostal junction syndrome [Tietze]: Secondary | ICD-10-CM | POA: Insufficient documentation

## 2014-03-13 DIAGNOSIS — R0602 Shortness of breath: Secondary | ICD-10-CM | POA: Insufficient documentation

## 2014-03-13 DIAGNOSIS — J45909 Unspecified asthma, uncomplicated: Secondary | ICD-10-CM | POA: Insufficient documentation

## 2014-03-13 LAB — BASIC METABOLIC PANEL
Anion gap: 8 (ref 5–15)
BUN: 10 mg/dL (ref 6–23)
CALCIUM: 8.6 mg/dL (ref 8.4–10.5)
CO2: 24 mmol/L (ref 19–32)
Chloride: 105 mEq/L (ref 96–112)
Creatinine, Ser: 0.62 mg/dL (ref 0.50–1.10)
GFR calc Af Amer: >90 mL/min (ref >90)
GFR calc non Af Amer: >90 mL/min (ref >90)
GLUCOSE: 76 mg/dL (ref 70–99)
POTASSIUM: 3.6 mmol/L (ref 3.5–5.1)
Sodium: 137 mmol/L (ref 135–145)

## 2014-03-13 LAB — CBC
HCT: 32.2 % — ABNORMAL LOW (ref 36.0–46.0)
Hemoglobin: 10.1 g/dL — ABNORMAL LOW (ref 12.0–15.0)
MCH: 23.9 pg — AB (ref 26.0–34.0)
MCHC: 31.4 g/dL (ref 30.0–36.0)
MCV: 76.3 fL — ABNORMAL LOW (ref 78.0–100.0)
PLATELETS: 338 10*3/uL (ref 150–400)
RBC: 4.22 MIL/uL (ref 3.87–5.11)
RDW: 17.9 % — AB (ref 11.5–15.5)
WBC: 3.8 10*3/uL — AB (ref 4.0–10.5)

## 2014-03-13 LAB — BRAIN NATRIURETIC PEPTIDE: B Natriuretic Peptide: 9.5 pg/mL (ref 0.0–100.0)

## 2014-03-13 LAB — I-STAT TROPONIN, ED: Troponin i, poc: 0 ng/mL (ref 0.00–0.08)

## 2014-03-13 MED ORDER — HYDROCODONE-ACETAMINOPHEN 5-325 MG PO TABS
1.0000 | ORAL_TABLET | Freq: Once | ORAL | Status: AC
  Administered 2014-03-13: 1 via ORAL
  Filled 2014-03-13: qty 1

## 2014-03-13 MED ORDER — IOHEXOL 350 MG/ML SOLN
80.0000 mL | Freq: Once | INTRAVENOUS | Status: AC | PRN
  Administered 2014-03-13: 67 mL via INTRAVENOUS

## 2014-03-13 MED ORDER — NAPROXEN 500 MG PO TABS
500.0000 mg | ORAL_TABLET | Freq: Two times a day (BID) | ORAL | Status: DC | PRN
Start: 2014-03-13 — End: 2014-08-20

## 2014-03-13 MED ORDER — HYDROCODONE-ACETAMINOPHEN 5-325 MG PO TABS: 1.0000 | ORAL_TABLET | Freq: Four times a day (QID) | ORAL | Status: DC | PRN

## 2014-03-13 MED ORDER — FERROUS SULFATE 325 (65 FE) MG PO TABS: 325.0000 mg | ORAL_TABLET | Freq: Every day | ORAL | Status: DC

## 2014-03-13 NOTE — Discharge Instructions (Signed)
Use naprosyn and norco as directed as needed for pain. Use heat over the affected area. Start taking iron supplementation as directed. See your regular doctor in 1 week for recheck. Return to the ER for changes or worsening symptoms   Chest Wall Pain Chest wall pain is pain in or around the bones and muscles of your chest. It may take up to 6 weeks to get better. It may take longer if you must stay physically active in your work and activities.  CAUSES  Chest wall pain may happen on its own. However, it may be caused by:  A viral illness like the flu.  Injury.  Coughing.  Exercise.  Arthritis.  Fibromyalgia.  Shingles. HOME CARE INSTRUCTIONS   Avoid overtiring physical activity. Try not to strain or perform activities that cause pain. This includes any activities using your chest or your abdominal and side muscles, especially if heavy weights are used.  Put ice on the sore area.  Put ice in a plastic bag.  Place a towel between your skin and the bag.  Leave the ice on for 15-20 minutes per hour while awake for the first 2 days.  Only take over-the-counter or prescription medicines for pain, discomfort, or fever as directed by your caregiver. SEEK IMMEDIATE MEDICAL CARE IF:   Your pain increases, or you are very uncomfortable.  You have a fever.  Your chest pain becomes worse.  You have new, unexplained symptoms.  You have nausea or vomiting.  You feel sweaty or lightheaded.  You have a cough with phlegm (sputum), or you cough up blood. MAKE SURE YOU:   Understand these instructions.  Will watch your condition.  Will get help right away if you are not doing well or get worse. Document Released: 02/24/2005 Document Revised: 05/19/2011 Document Reviewed: 10/21/2010 [facility] Patient Information 2015 [place], [place]. This information is not intended to replace advice given to you by your health care provider. Make sure you discuss any questions you have with your  health care provider.  Anemia, Nonspecific Anemia is a condition in which the concentration of red blood cells or hemoglobin in the blood is below normal. Hemoglobin is a substance in red blood cells that carries oxygen to the tissues of the body. Anemia results in not enough oxygen reaching these tissues.  CAUSES  Common causes of anemia include:   Excessive bleeding. Bleeding may be internal or external. This includes excessive bleeding from periods (in women) or from the intestine.   Poor nutrition.   Chronic kidney, thyroid, and liver disease.  Bone marrow disorders that decrease red blood cell production.  Cancer and treatments for cancer.  HIV, AIDS, and their treatments.  Spleen problems that increase red blood cell destruction.  Blood disorders.  Excess destruction of red blood cells due to infection, medicines, and autoimmune disorders. SIGNS AND SYMPTOMS   Minor weakness.   Dizziness.   Headache.  Palpitations.   Shortness of breath, especially with exercise.   Paleness.  Cold sensitivity.  Indigestion.  Nausea.  Difficulty sleeping.  Difficulty concentrating. Symptoms may occur suddenly or they may develop slowly.  DIAGNOSIS  Additional blood tests are often needed. These help your health care provider determine the best treatment. Your health care provider will check your stool for blood and look for other causes of blood loss.  TREATMENT  Treatment varies depending on the cause of the anemia. Treatment can include:   Supplements of iron, vitamin Z61, or folic acid.   Hormone medicines.  A blood transfusion. This may be needed if blood loss is severe.   Hospitalization. This may be needed if there is significant continual blood loss.   Dietary changes.  Spleen removal. HOME CARE INSTRUCTIONS Keep all follow-up appointments. It often takes many weeks to correct anemia, and having your health care provider check on your condition  and your response to treatment is very important. SEEK IMMEDIATE MEDICAL CARE IF:   You develop extreme weakness, shortness of breath, or chest pain.   You become dizzy or have trouble concentrating.  You develop heavy vaginal bleeding.   You develop a rash.   You have bloody or black, tarry stools.   You faint.   You vomit up blood.   You vomit repeatedly.   You have abdominal pain.  You have a fever or persistent symptoms for more than 2-3 days.   You have a fever and your symptoms suddenly get worse.   You are dehydrated.  MAKE SURE YOU:  Understand these instructions.  Will watch your condition.  Will get help right away if you are not doing well or get worse. Document Released: 04/03/2004 Document Revised: 10/27/2012 Document Reviewed: 08/20/2012 [facility] Patient Information 2015 [place], [place]. This information is not intended to replace advice given to you by your health care provider. Make sure you discuss any questions you have with your health care provider.

## 2014-03-13 NOTE — ED Provider Notes (Signed)
CSN: [id number]     Arrival date & time 03/13/14  1339 History   First MD Initiated Contact with Patient 03/13/14 1640     Chief Complaint  Patient presents with  . Shortness of Breath  . Chest Pain     (Consider location/radiation/quality/duration/timing/severity/associated sxs/prior Treatment) HPI Comments: Taylor Cook is a 49 y.o. Cook with a PMHx of asthma, bronchitis, and seasonal allergies, who presents to the ED with complaints of left-sided chest pain that gradually began approximately 2 months ago associated with a bruise of uncertain etiology. She states there are no triggers for her chest pain, and she is unsure why she developed a bruise, but that she noticed this approximately 2 months ago and has been experiencing 5/10 intermittent sharp sore pain which is nonradiating and located just under her left breast and along the rib margin, with no known aggravating or alleviating factors. She states that when the pain hits, it "cuts her breath off" but she denies having ongoing shortness of breath. She has no known trauma or injury. She works as a Writer, and denies that her job has caused any strain to this area of her chest. She denies fevers, chills, cough, wheezing, abdominal pain, nausea, vomiting, diarrhea, constipation, diaphoresis, lower external swelling, recent travel or surgeries, recent immobilization, IV drug use or estrogen, dysuria, hematuria, paresthesias, weakness, numbness, or rashes.  Patient is a 49 y.o. Cook presenting with chest pain. The history is provided by the patient. No language interpreter was used.  Chest Pain Pain location:  L lateral chest Pain quality: sharp   Pain quality comment:  Sharp and soreness Pain radiates to:  Does not radiate Pain radiates to the back: no   Pain severity:  Moderate (5/10) Onset quality:  Gradual Duration:  2 months Timing:  Intermittent Progression:  Unchanged Chronicity:  New Context comment:  No known  trigger/context Relieved by:  None tried Worsened by:  Nothing tried Ineffective treatments:  None tried Associated symptoms: no abdominal pain, no anxiety, no back pain, no claudication, no cough, no diaphoresis, no dizziness, no dysphagia, no fever, no headache, no heartburn, no lower extremity edema, no nausea, no near-syncope, no numbness, no orthopnea, no palpitations, no PND, no shortness of breath, no syncope, not vomiting and no weakness   Risk factors: smoking   Risk factors: no immobilization, no prior DVT/PE and no surgery     Past Medical History  Diagnosis Date  . Asthma   . Bronchitis   . Seasonal allergies    Past Surgical History  Procedure Laterality Date  . Breast surgery      removal of 2 cyst  . Tubal ligation     Family History  Problem Relation Age of Onset  . Stroke      fx  . Heart attack      fx  . Heart disease      fx  . Hypertension      fx  . Cancer      fx   History  Substance Use Topics  . Smoking status: Current Some Day Smoker    Types: Cigarettes  . Smokeless tobacco: Not on file  . Alcohol Use: No   OB History    No data available     Review of Systems  Constitutional: Negative for fever, chills and diaphoresis.  HENT: Negative for rhinorrhea, sinus pressure, sore throat and trouble swallowing.   Respiratory: Negative for cough, shortness of breath and wheezing.   Cardiovascular:  Positive for chest pain (L sided). Negative for palpitations, orthopnea, claudication, leg swelling, syncope, PND and near-syncope.  Gastrointestinal: Negative for heartburn, nausea, vomiting, abdominal pain, diarrhea and constipation.  Genitourinary: Negative for dysuria, hematuria, vaginal bleeding and vaginal discharge.  Musculoskeletal: Negative for myalgias, back pain, arthralgias and neck pain.  Skin: Positive for color change (bruising).  Neurological: Negative for dizziness, weakness, numbness and headaches.  Hematological: Does not  bruise/bleed easily.  Psychiatric/Behavioral: Negative for confusion.   10 Systems reviewed and are negative for acute change except as noted in the HPI.    Allergies  Review of patient's allergies indicates no known allergies.  Home Medications   Prior to Admission medications   Medication Sig Start Date End Date Taking? Authorizing Provider  aspirin 81 MG tablet Take 81 mg by mouth daily.   Yes Historical Provider, MD  ibuprofen (ADVIL,MOTRIN) 200 MG tablet Take 200 mg by mouth every 6 (six) hours as needed for moderate pain.   Yes Historical Provider, MD   BP 155/90 mmHg  Pulse 82  Temp(Src) 97.9 F (36.6 C) (Oral)  Resp 18  Ht 5\' 4"  (1.626 m)  Wt 145 lb (Taylor.772 kg)  BMI 24.88 kg/m2  SpO2 100%  LMP 02/26/2014 Physical Exam  Constitutional: She is oriented to person, place, and time. Vital signs are normal. She appears well-developed and well-nourished.  Non-toxic appearance. No distress.  Afebrile, nontoxic, NAD  HENT:  Head: Normocephalic and atraumatic.  Mouth/Throat: Oropharynx is clear and moist and mucous membranes are normal.  Eyes: Conjunctivae and EOM are normal. Right eye exhibits no discharge. Left eye exhibits no discharge.  Neck: Normal range of motion. Neck supple.  Cardiovascular: Normal rate, regular rhythm, normal heart sounds and intact distal pulses.  Exam reveals no gallop and no friction rub.   No murmur heard. RRR, nl s1/s2, no m/r/g  Pulmonary/Chest: Effort normal and breath sounds normal. No respiratory distress. She has no decreased breath sounds. She has no wheezes. She has no rhonchi. She has no rales. She exhibits tenderness. She exhibits no crepitus, no swelling and no retraction.    CTAB in all lung fields Chest wall TTP along L rib margin from mid-clavicular line anterior axillary line, with bruising noted to this area, no crepitus or retractions, no subQ air  Abdominal: Soft. Normal appearance and bowel sounds are normal. She exhibits no  distension. There is no tenderness. There is no rigidity, no rebound and no guarding.  Musculoskeletal: Normal range of motion.  MAE x4 No pedal edema  Neurological: She is alert and oriented to person, place, and time. She has normal strength. No sensory deficit.  Skin: Skin is warm, dry and intact. Bruising noted. No rash noted.  Bruise to L chest wall as noted above  Psychiatric: She has a normal mood and affect.  Nursing note and vitals reviewed.   ED Course  Procedures (including critical care time) Labs Review Labs Reviewed  CBC - Abnormal; Notable for the following:    WBC 3.8 (*)    Hemoglobin 10.1 (*)    HCT 32.2 (*)    MCV 76.3 (*)    MCH 23.9 (*)    RDW 17.9 (*)    All other components within normal limits  BASIC METABOLIC PANEL  BRAIN NATRIURETIC PEPTIDE  I-STAT TROPOININ, ED    Imaging Review Dg Chest 2 View  03/13/2014   CLINICAL DATA:  LEFT-sided chest pain. Symptoms for 1 month. Shortness of breath.  EXAM: CHEST  2 VIEW  COMPARISON:  Chest CT 12/10/2013.  Chest radiograph 12/10/2013  FINDINGS: Cardiopericardial silhouette within normal limits. Mediastinal contours normal. Trachea midline. No airspace disease or effusion.  IMPRESSION: No active cardiopulmonary disease.   Electronically Signed   By: Dereck Ligas M.D.   On: 03/13/2014 14:38   Ct Angio Chest Pe W/cm &/or Wo Cm  03/13/2014   CLINICAL DATA:  Left-sided chest pain.  Shortness of breath.  EXAM: CT ANGIOGRAPHY CHEST WITH CONTRAST  TECHNIQUE: Multidetector CT imaging of the chest was performed using the standard protocol during bolus administration of intravenous contrast. Multiplanar CT image reconstructions and MIPs were obtained to evaluate the vascular anatomy.  CONTRAST:  26mL OMNIPAQUE IOHEXOL 350 MG/ML SOLN  COMPARISON:  Radiographs earlier the same day, chest CT PE 12/10/2013  FINDINGS: There are no filling defects within the pulmonary arteries to suggest pulmonary embolus. Borderline cardiomegaly  again seen. The thoracic aorta is normal in caliber. There is variant arch anatomy with common origin of the brachiocephalic and left common carotid artery. Again noted mediastinal and bilateral hilar adenopathy. This appears similar to prior exam. For example subcarinal lymph node measures 2.1 cm in short axis dimension, previously 2.1 cm, AP window lymph node measures 1.4 cm, previously 1.5 cm. Hilar adenopathy is grossly stable. No axillary or internal mammary adenopathy. There is no pleural or pericardial effusion.  Scattered fissural thickening bilaterally, slightly nodular in the right minor fissure, unchanged from prior exam. Increased atelectasis in the lingula. Subsegmental atelectasis in the right lower lobe is unchanged. No parenchymal consolidation.  No acute abnormality in the included upper abdomen. No acute osseous abnormalities. Tiny bone island is seen in the thoracic spine.  Review of the MIP images confirms the above findings.  IMPRESSION: 1. No pulmonary embolus. 2. Bilateral hilar and mediastinal adenopathy. This is unchanged from prior exam. 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 Lymphoma and metastatic disease, not entirely excluded but felt less likely. 3. Bilateral fissural thickening, nodular in the right minor fissure, also unchanged from prior exam and likely inflammatory. 4. No new intrathoracic abnormality.   Electronically Signed   By: Jeb Levering M.D.   On: 03/13/2014 19:32     EKG Interpretation   Date/Time:  Monday March 13 2014 13:44:03 EST Ventricular Rate:  83 PR Interval:  162 QRS Duration: 92 QT Interval:  402 QTC Calculation: 472 R Axis:   -2 Text Interpretation:  Normal sinus rhythm Normal ECG Sinus rhythm Artifact  Abnormal ekg Confirmed by Carmin Muskrat  MD ([id number]) on 03/13/2014 7:49:26  PM      MDM   Final diagnoses:  Costochondritis  Atypical chest pain  Anemia, unspecified anemia type    49 y.o. Cook with L  sided CP which is reproducible on exam, with a hematoma noted under L breast. No trauma or known injury. Ongoing x 54months. Trop neg, BNP WNL, EKG unremarkable, CXR unremarkable, labs showing baseline anemia but otherwise WNL. Will give percocet now for pain (since unknown source of hematoma, don't want to risk worsening bleeding). Discussed case with Dr. Vanita Panda who agreed that the lack of known mechanism for this bruise is odd, options given to pt for pain control and f/up with PCP vs CT chest now to get better definition of chest wall and evaluate any possible etiology of bruise. Pt chose to proceed with CT  7:48 PM Pt with some improvement of pain. CTA showing stable bilateral hilar LAD but otherwise no acute abnormalities. Discussed use of naprosyn and  norco for pain, as well as heat therapy. Will have her f/up with PCP in 1wk (pt states she see's "Nunzio Cory something, across the [place]"). Discussed starting iron for her anemia. I explained the diagnosis and have given explicit precautions to return to the ER including for any other new or worsening symptoms. The patient understands and accepts the medical plan as it's been dictated and I have answered their questions. Discharge instructions concerning home care and prescriptions have been given. The patient is STABLE and is discharged to home in good condition.  BP 133/58 mmHg  Pulse 84  Temp(Src) 97.9 F (36.6 C) (Oral)  Resp 28  Ht 5\' 4"  (1.626 m)  Wt 145 lb (Taylor.772 kg)  BMI 24.88 kg/m2  SpO2 100%  LMP 02/26/2014  Meds ordered this encounter  Medications  . ibuprofen (ADVIL,MOTRIN) 200 MG tablet    Sig: Take 200 mg by mouth every 6 (six) hours as needed for moderate pain.  Marland Kitchen HYDROcodone-acetaminophen (NORCO/VICODIN) 5-325 MG per tablet 1 tablet    Sig:   . iohexol (OMNIPAQUE) 350 MG/ML injection 80 mL    Sig:   . HYDROcodone-acetaminophen (NORCO) 5-325 MG per tablet    Sig: Take 1-2 tablets by mouth every 6 (six) hours as needed for  severe pain.    Dispense:  6 tablet    Refill:  0    Order Specific Question:  Supervising Provider    Answer:  Noemi Chapel D [[id number]]  . naproxen (NAPROSYN) 500 MG tablet    Sig: Take 1 tablet (500 mg total) by mouth 2 (two) times daily as needed for mild pain, moderate pain or headache (TAKE WITH MEALS.).    Dispense:  20 tablet    Refill:  0    Order Specific Question:  Supervising Provider    Answer:  Noemi Chapel D [[id number]]  . ferrous sulfate 325 (Taylor FE) MG tablet    Sig: Take 1 tablet (325 mg total) by mouth daily with breakfast. Take with orange juice at breakfast    Dispense:  30 tablet    Refill:  0    Order Specific Question:  Supervising Provider    Answer:  Johnna Acosta [address] [place], PA-C 03/13/14 1952  Carmin Muskrat, MD 03/13/14 [phone]

## 2014-03-13 NOTE — ED Notes (Signed)
Pt reports left sided chest pain and soreness for the past month. States that she has also been SOB. Pt with bruising to the left side.

## 2014-08-20 ENCOUNTER — Emergency Department (HOSPITAL_COMMUNITY): Payer: Self-pay

## 2014-08-20 ENCOUNTER — Encounter (HOSPITAL_COMMUNITY): Payer: Self-pay | Admitting: Emergency Medicine

## 2014-08-20 ENCOUNTER — Emergency Department (HOSPITAL_COMMUNITY)
Admission: EM | Admit: 2014-08-20 | Discharge: 2014-08-20 | Disposition: A | Discharge status: Home / Self Care | Payer: Self-pay | Attending: Emergency Medicine | Admitting: Emergency Medicine

## 2014-08-20 DIAGNOSIS — R200 Anesthesia of skin: Secondary | ICD-10-CM | POA: Insufficient documentation

## 2014-08-20 DIAGNOSIS — J45909 Unspecified asthma, uncomplicated: Secondary | ICD-10-CM | POA: Insufficient documentation

## 2014-08-20 DIAGNOSIS — Z862 Personal history of diseases of the blood and blood-forming organs and certain disorders involving the immune mechanism: Secondary | ICD-10-CM | POA: Insufficient documentation

## 2014-08-20 DIAGNOSIS — Z7982 Long term (current) use of aspirin: Secondary | ICD-10-CM | POA: Insufficient documentation

## 2014-08-20 DIAGNOSIS — R112 Nausea with vomiting, unspecified: Secondary | ICD-10-CM | POA: Insufficient documentation

## 2014-08-20 DIAGNOSIS — Z720 Tobacco use: Secondary | ICD-10-CM | POA: Insufficient documentation

## 2014-08-20 LAB — BASIC METABOLIC PANEL
Anion gap: 8 (ref 5–15)
BUN: 9 mg/dL (ref 6–20)
CHLORIDE: 107 mmol/L (ref 101–111)
CO2: 21 mmol/L — ABNORMAL LOW (ref 22–32)
Calcium: 8.9 mg/dL (ref 8.9–10.3)
Creatinine, Ser: 0.73 mg/dL (ref 0.44–1.00)
GFR calc Af Amer: >60 mL/min (ref >60)
GFR calc non Af Amer: >60 mL/min (ref >60)
Glucose, Bld: 97 mg/dL (ref 65–99)
Potassium: 3.7 mmol/L (ref 3.5–5.1)
SODIUM: 136 mmol/L (ref 135–145)

## 2014-08-20 LAB — CBC WITH DIFFERENTIAL/PLATELET
BASOS ABS: 0 10*3/uL (ref 0.0–0.1)
Basophils Relative: 1 % (ref 0–1)
EOS PCT: 1 % (ref 0–5)
Eosinophils Absolute: 0.1 10*3/uL (ref 0.0–0.7)
HCT: 34.6 % — ABNORMAL LOW (ref 36.0–46.0)
Hemoglobin: 11.5 g/dL — ABNORMAL LOW (ref 12.0–15.0)
LYMPHS ABS: 1.2 10*3/uL (ref 0.7–4.0)
Lymphocytes Relative: 33 % (ref 12–46)
MCH: 27.4 pg (ref 26.0–34.0)
MCHC: 33.2 g/dL (ref 30.0–36.0)
MCV: 82.4 fL (ref 78.0–100.0)
MONO ABS: 0.4 10*3/uL (ref 0.1–1.0)
Monocytes Relative: 12 % (ref 3–12)
NEUTROS ABS: 1.9 10*3/uL (ref 1.7–7.7)
Neutrophils Relative %: 53 % (ref 43–77)
PLATELETS: 266 10*3/uL (ref 150–400)
RBC: 4.2 MIL/uL (ref 3.87–5.11)
RDW: 16.3 % — ABNORMAL HIGH (ref 11.5–15.5)
WBC: 3.5 10*3/uL — AB (ref 4.0–10.5)

## 2014-08-20 MED ORDER — ONDANSETRON HCL 4 MG PO TABS
8.0000 mg | ORAL_TABLET | Freq: Once | ORAL | Status: AC
  Administered 2014-08-20: 8 mg via ORAL
  Filled 2014-08-20: qty 2

## 2014-08-20 MED ORDER — IBUPROFEN 800 MG PO TABS
800.0000 mg | ORAL_TABLET | Freq: Once | ORAL | Status: AC
  Administered 2014-08-20: 800 mg via ORAL
  Filled 2014-08-20: qty 1

## 2014-08-20 NOTE — ED Provider Notes (Signed)
CSN: [id number]     Arrival date & time 08/20/14  1258 History   First MD Initiated Contact with Patient 08/20/14 1303     Chief Complaint  Patient presents with  . Nausea  . Emesis  . Numbness     (Consider location/radiation/quality/duration/timing/severity/associated sxs/prior Treatment) HPI...... intermittent numbness of left upper extremity earlier today. Symptoms have now improved. Yesterday she felt nauseated and vomited. Past medical history includes anemia but no hypertension or diabetes. Nothing makes symptoms better or worse.  No arm or leg weakness, facial asymmetry, confusion, altered mental status, fever, stiff neck.  Past Medical History  Diagnosis Date  . Asthma   . Bronchitis   . Seasonal allergies    Past Surgical History  Procedure Laterality Date  . Breast surgery      removal of 2 cyst  . Tubal ligation     Family History  Problem Relation Age of Onset  . Stroke      fx  . Heart attack      fx  . Heart disease      fx  . Hypertension      fx  . Cancer      fx   History  Substance Use Topics  . Smoking status: Current Some Day Smoker    Types: Cigarettes  . Smokeless tobacco: Not on file  . Alcohol Use: No   OB History    No data available     Review of Systems  All other systems reviewed and are negative.     Allergies  Review of patient's allergies indicates no known allergies.  Home Medications   Prior to Admission medications   Medication Sig Start Date End Date Taking? Authorizing Provider  aspirin 81 MG tablet Take 81 mg by mouth daily.   Yes Historical Provider, MD  ibuprofen (ADVIL,MOTRIN) 200 MG tablet Take 200 mg by mouth every 6 (six) hours as needed for moderate pain.   Yes Historical Provider, MD   BP 137/80 mmHg  Pulse 59  Temp(Src) 98.2 F (36.8 C)  Resp 18  Ht 5\' 3"  (1.6 m)  SpO2 99%  LMP 08/18/2014 Physical Exam  Constitutional: She is oriented to person, place, and time. She appears well-developed and  well-nourished.  HENT:  Head: Normocephalic and atraumatic.  Eyes: Conjunctivae and EOM are normal. Pupils are equal, round, and reactive to light.  Neck: Normal range of motion. Neck supple.  Cardiovascular: Normal rate and regular rhythm.   Pulmonary/Chest: Effort normal and breath sounds normal.  Abdominal: Soft. Bowel sounds are normal.  Musculoskeletal: Normal range of motion.  Neurological: She is alert and oriented to person, place, and time.  Full range of motion of left upper extremity  Skin: Skin is warm and dry.  Psychiatric: She has a normal mood and affect. Her behavior is normal.  Nursing note and vitals reviewed.   ED Course  Procedures (including critical care time) Labs Review Labs Reviewed  CBC WITH DIFFERENTIAL/PLATELET - Abnormal; Notable for the following:    WBC 3.5 (*)    Hemoglobin 11.5 (*)    HCT 34.6 (*)    RDW 16.3 (*)    All other components within normal limits  BASIC METABOLIC PANEL - Abnormal; Notable for the following:    CO2 21 (*)    All other components within normal limits    Imaging Review Ct Head Wo Contrast  08/20/2014   CLINICAL DATA:  Left arm numbness beginning at 11:45 a.m.  EXAM:  CT HEAD WITHOUT CONTRAST  TECHNIQUE: Contiguous axial images were obtained from the base of the skull through the vertex without intravenous contrast.  COMPARISON:  None.  FINDINGS: There is no evidence of mass effect, midline shift or extra-axial fluid collections. There is no evidence of a space-occupying lesion or intracranial hemorrhage. There is no evidence of a cortical-based area of acute infarction.  The ventricles and sulci are appropriate for the patient's age. The basal cisterns are patent.  Visualized portions of the orbits are unremarkable. The visualized portions of the paranasal sinuses and mastoid air cells are unremarkable.  The osseous structures are unremarkable.  IMPRESSION: Normal CT of the brain without intravenous contrast.   Electronically  Signed   By: Kathreen Devoid   On: 08/20/2014 14:32     EKG Interpretation   Date/Time:  Sunday August 20 2014 13:05:01 EDT Ventricular Rate:  77 PR Interval:    QRS Duration: 127 QT Interval:  399 QTC Calculation: 452 R Axis:   -21 Text Interpretation:  NSR Nonspecific intraventricular conduction delay  Artifact in lead(s) I III aVR aVL aVF Confirmed by Gjon Letarte  MD, Keiarra Charon ([id number])  on 08/20/2014 1:18:07 PM      MDM   Final diagnoses:  Left arm numbness   Patient is in no acute distress. CT head shows no acute findings. Patient feels much better at discharge. She is encouraged to get a primary care doctor. Will return if worse    Nat Christen, MD 08/20/14 [phone]

## 2014-08-20 NOTE — Discharge Instructions (Signed)
Home to rest. Recommend attempting to get a primary care physician. Resource guide given.    Emergency Department Resource Guide 1) Find a Doctor and Pay Out of Pocket Although you won't have to find out who is covered by your insurance plan, it is a good idea to ask around and get recommendations. You will then need to call the office and see if the doctor you have chosen will accept you as a new patient and what types of options they offer for patients who are self-pay. Some doctors offer discounts or will set up payment plans for their patients who do not have insurance, but you will need to ask so you aren't surprised when you get to your appointment.  2) Contact Your Local Health Department Not all health departments have doctors that can see patients for sick visits, but many do, so it is worth a call to see if yours does. If you don't know where your local health department is, you can check in your phone book. The CDC also has a tool to help you locate your state's health department, and many state websites also have listings of all of their local health departments.  3) Find a [facility] If your illness is not likely to be very severe or complicated, you may want to try a walk in clinic. These are popping up all over the country in pharmacies, drugstores, and shopping centers. They're usually staffed by nurse practitioners or physician assistants that have been trained to treat common illnesses and complaints. They're usually fairly quick and inexpensive. However, if you have serious medical issues or chronic medical problems, these are probably not your best option.  No Primary Care Doctor: - Call Health Connect at  [phone] - they can help you locate a primary care doctor that  accepts your insurance, provides certain services, etc. - Physician Referral Service- [phone]  Chronic Pain Problems: Organization         Address  Phone   Notes  [facility]   [phone] Patients need to be referred by their primary care doctor.   Medication Assistance: Organization         Address  Phone   Notes  [facility] Medication [facility] [address] [place], [place] [address] [phone] --Must be a resident of [facility] -- Must have NO insurance coverage whatsoever (no Medicaid/ Medicare, etc.) -- The pt. MUST have a primary care doctor that directs their care regularly and follows them in the community   MedAssist  [phone]   [employer]  [phone]    Agencies that provide inexpensive medical care: Organization         Address  Phone   Notes  [place]  [phone]   [facility]    [phone]   [facility], [place] [address] [phone]   [place] [address], [place] [phone]   Planned Parenthood    [phone]   [facility]    [phone]   [place] and [place] [address], [place] Phone:  [phone], Fax:  [phone] Hours of Operation:  9 am - 6 pm, M-F.  Also accepts Medicaid/Medicare and self-pay.  [facility] [place], [address], [place] Phone: [phone], Fax: [phone]. Hours of Operation:  8:30 am - 5:30 pm, M-F.  Also accepts Medicaid and self-pay.  [facility] [place] [address] [place] Phone: [phone]   [place], [place], [place] [phone], Ext. [phone] Mondays & Thursdays: 7-9 AM.  First 15 patients are seen on a first come, first serve basis.    [place] Providers:  Organization         Address  Phone   Notes  [facility] [address], [place] [phone] Also accepts self-pay patients.  [facility] [address], [place]  [phone]   [place], [address], [place] [phone]   [facility] [address], [place] [phone]   Lucianne Lei [address], [place]   [phone] Only accepts [place] Access [place] patients after they have their name applied to their card.   Self-Pay (no insurance) in [facility]:  Organization         Address  Phone   Notes  Sickle Cell Patients, [facility] [place] [phone]   [facility] [place] [phone]   [facility] [place]  [place], [place], [employer] [phone]   [facility]/Dr. Osei-Bonsu  [address] [place] or [address], [place] [phone] Phone number for both [place] and [place] locations is the same.  Urgent Medical and [facility] [address], [place] [phone]   [facility] [address] [place] or [address] [phone]   [facility] [address] [place] [phone], phone; [phone], fax Sees patients 1st and 3rd Saturday of every month.  Must not qualify for public or private insurance (i.e. Medicaid, Medicare, [place] Health Choice, Veterans' Benefits)  Household income should be no more than 200% of the poverty level The clinic cannot treat you if you are pregnant or think you are pregnant  Sexually transmitted diseases are not treated at the clinic.    Dental Care: Organization         Address  Phone  Notes  [facility] Department of [facility] [place] [phone] Accepts children up to age 10 who are enrolled in [place] or [place]; pregnant women with a Medicaid card; and children who have applied for Medicaid or [place] Health  Choice, but were declined, whose parents can pay a reduced fee at time of service.  [facility] Department of [facility]  [address], [place] [phone] Accepts children up to age 32 who are enrolled in [place] or [place]; pregnant women with a Medicaid card; and children who have applied for Medicaid or [place] Health Choice, but were declined, whose parents can pay a reduced fee at time of service.  [place] Adult Dental Access PROGRAM  [place] [phone] Patients are seen by appointment only. Walk-ins are not accepted. [place] will see patients 78 years of age and older. Monday - Tuesday (8am-5pm) Most Wednesdays (8:30-5pm) $30 per visit, cash only  [facility] Adult Dental Access PROGRAM  [address], [facility] [phone] Patients are seen by appointment  only. Walk-ins are not accepted. [place] will see patients 69 years of age and older. One Wednesday Evening (Monthly: Volunteer Based).  $30 per visit, cash only  [place]  [phone] for adults; Children under age 5, call Graduate Pediatric Dentistry at [phone]. Children aged 58-14, please call [phone] to request a pediatric application.  Dental services are provided in all areas of dental care including fillings, crowns and bridges, complete and partial dentures, implants, gum treatment, root canals, and extractions. Preventive care is also provided. Treatment is provided to both adults and children. Patients are selected via a lottery and there is often a waiting list.   [facility] [address], [place]  [phone] [email or web address]   [facility] [address] [place], [place] [phone], Ext. [phone] Second and Fourth Thursday of each month, opens at 6:30 AM; Clinic ends at 9 AM.  Patients are seen on a first-come first-served basis, and a limited number are seen during each  clinic.   [facility]  [address] Hillard Danker [place], [place] [phone]   Eligibility Requirements You must have lived in [place], [place], or [place] counties for at least the last three months.   You cannot be eligible for state or federal sponsored [employer], including [employer], [place], or [employer].   You generally cannot be eligible for healthcare insurance through your employer.    How to apply: Eligibility screenings are held every Tuesday and Wednesday afternoon from 1:00 pm until 4:00 pm. You do not need an appointment for the interview!  [facility] [address] [place], [place]   [place]  [place] Department  [place]  [phone]    Behavioral Health Resources in the Community: Intensive Outpatient Programs Organization         Address  Phone  Notes  [place]. [address] [place], [place] [phone]   [facility] Outpatient [address], [place], [place]   ADS: Alcohol & Drug Svcs [address] [place], [place]   [place] [address]  [place], [place] or [phone]   Substance Abuse Resources Organization         Address  Phone  Notes  Alcohol and Drug Services  [phone]   [place]  8025057661   [facility] [place]   Chinita Pester  [phone]   Residential & Outpatient Substance Abuse Program  [phone]   Psychological Services Organization         Address  Phone  Notes  [facility] [place]  [place]  [phone]   [place] [address] [place] or [phone]    Mobile Crisis Teams Organization         Address  Phone  Notes  [facility]atives, Mobile  Crisis Care Unit  [phone]   Assertive Psychotherapeutic Services  [address] [place], [place]   [place] [address] [place] [phone]    Self-Help/Support Groups Organization         Address  Phone             Notes  [place]. of  - variety of support groups  [place] Call for more information  Narcotics Anonymous (NA), [facility]aring [facility] [address], [employer] [place]  2 meetings at this location   Special educational needs teacher         Address  Phone  Notes  [facility] Residential Treatment [place],    [place]  [phone]   [facility]  [address], [place] [phone], [place], [place]   [place], [place] [phone] Admissions: 8am-3pm M-F  Incentives Substance [place] [place] [address],    [place], [place] [phone]   [facility] [address] [place], [place], [place]   The [facility] [address]  [place], [place]   [facility] Programs - Intensive Outpatient [address] Kristeen [address], [place], [place]   [facility] [place].,  [place], [place] [phone] or [phone]   [facility] Services (RTS) [address] [place], [place] Accepts Medicaid  [facility] [place] [address]  [place] [phone] Substance Abuse/Addiction Treatment   [facility] Organization         Address  Phone  Notes  [facility]  [phone]   Domenic Schwab, PhD [address] Arlis Porta [place], [place]   [phone] or [phone]   [place], [place] [phone]   [facility] [address], [place], [place] [phone] Insurance/Medicaid/sponsorship through [facility] and Families [address], [address]  [place]                                    [place], [place] [phone] [place] [address], [place] [phone]    Dr. Adele Schilder  [phone]   [facility] Dept. 1) [address] [place] 2) [place] 3)  [place] [address], [place] [phone]  [phone]   [place] [phone] or [phone] (After Hours)

## 2014-08-20 NOTE — ED Notes (Signed)
Per [facility], pt started feeling sick yesterday, was vomiting. Today at work also felt sick and hot, Pt left arm had intermittent numbness. At the moment, no numbness. Pt LSN was 1200. Pt on blood thinner for "anemia" per patient. Pt denies CP or SOB. Stroke scale negative. Pt is AAOX4, in NAD. 12 lead per ems unremarkable.

## 2014-11-22 ENCOUNTER — Encounter (HOSPITAL_COMMUNITY): Payer: Self-pay | Admitting: Emergency Medicine

## 2014-11-22 ENCOUNTER — Emergency Department (HOSPITAL_COMMUNITY)
Admission: EM | Admit: 2014-11-22 | Discharge: 2014-11-22 | Disposition: A | Discharge status: Home / Self Care | Payer: Self-pay | Attending: Emergency Medicine | Admitting: Emergency Medicine

## 2014-11-22 DIAGNOSIS — H00013 Hordeolum externum right eye, unspecified eyelid: Secondary | ICD-10-CM

## 2014-11-22 DIAGNOSIS — Z7982 Long term (current) use of aspirin: Secondary | ICD-10-CM | POA: Insufficient documentation

## 2014-11-22 DIAGNOSIS — H00012 Hordeolum externum right lower eyelid: Secondary | ICD-10-CM | POA: Insufficient documentation

## 2014-11-22 DIAGNOSIS — J45909 Unspecified asthma, uncomplicated: Secondary | ICD-10-CM | POA: Insufficient documentation

## 2014-11-22 DIAGNOSIS — Z720 Tobacco use: Secondary | ICD-10-CM | POA: Insufficient documentation

## 2014-11-22 MED ORDER — ERYTHROMYCIN 5 MG/GM OP OINT: TOPICAL_OINTMENT | OPHTHALMIC | Status: DC

## 2014-11-22 NOTE — ED Provider Notes (Signed)
CSN: [id number]     Arrival date & time 11/22/14  1149 History  This chart was scribed for non-physician practitioner, Delos Haring, PA-C working with Alfonzo Beers, MD, by Erling Conte, ED Scribe. This patient was seen in room TR04C/TR04C and the patient's care was started at 1:49 PM.     Chief Complaint  Patient presents with  . Stye   The history is provided by the patient. No language interpreter was used.    HPI Comments: Taylor Cook is a 49 y.o. female who presents to the Emergency Department complaining of constant, moderate, right eye pain onset 2 weeks. She states she has a red, painful lump near the edge of her right eye lid. She states the pain is not exacerbated with eye movement. Pt has been using Visine eye drops with no relief. She denies any other meds PTA. Pt denies any visual disturbance, photophobia, fever, chills, or neck pain.  Past Medical History  Diagnosis Date  . Asthma   . Bronchitis   . Seasonal allergies    Past Surgical History  Procedure Laterality Date  . Breast surgery      removal of 2 cyst  . Tubal ligation     Family History  Problem Relation Age of Onset  . Stroke      fx  . Heart attack      fx  . Heart disease      fx  . Hypertension      fx  . Cancer      fx   Social History  Substance Use Topics  . Smoking status: Current Some Day Smoker    Types: Cigarettes  . Smokeless tobacco: None  . Alcohol Use: No   OB History    No data available     Review of Systems  Constitutional: Negative for fever and chills.  Eyes: Positive for pain and redness. Negative for photophobia and visual disturbance.  Musculoskeletal: Negative for neck pain.  All other systems reviewed and are negative.   Allergies  Review of patient's allergies indicates no known allergies.  Home Medications   Prior to Admission medications   Medication Sig Start Date End Date Taking? Authorizing Provider  aspirin 81 MG tablet Take 81 mg by mouth  daily.    Historical Provider, MD  ibuprofen (ADVIL,MOTRIN) 200 MG tablet Take 200 mg by mouth every 6 (six) hours as needed for moderate pain.    Historical Provider, MD   Triage Vitals: BP 163/90 mmHg  Pulse 75  Temp(Src) 98.4 F (36.9 C) (Oral)  Resp 14  Ht 5\' 2"  (1.575 m)  Wt 152 lb 1.6 oz (68.992 kg)  BMI 27.81 kg/m2  SpO2 100%  LMP 10/27/2014  Physical Exam  Constitutional: She is oriented to person, place, and time. She appears well-developed and well-nourished. No distress.  HENT:  Head: Normocephalic and atraumatic.  Eyes: Conjunctivae and EOM are normal. Pupils are equal, round, and reactive to light. Right conjunctiva is not injected. Right conjunctiva has no hemorrhage. Left conjunctiva is not injected. Left conjunctiva has no hemorrhage. No scleral icterus.  2 small styes to lower eyelid margin, tender to the touch. Small pustule visualized.   Neck: Neck supple. No tracheal deviation present.  Cardiovascular: Normal rate.   Pulmonary/Chest: Effort normal. No respiratory distress.  Musculoskeletal: Normal range of motion.  Neurological: She is alert and oriented to person, place, and time.  Skin: Skin is warm and dry.  Psychiatric: She has a normal mood and  affect. Her behavior is normal.  Nursing note and vitals reviewed.   ED Course  Procedures (including critical care time)  DIAGNOSTIC STUDIES: Oxygen Saturation is 100% on RA, normal by my interpretation.    COORDINATION OF CARE: 2:01 PM- Advised warm compresses as much as possible. Recommended to call opthalmologist today to schedule f/u if symptoms do not get better. Will prescribe erythromycin ointment. No systemic symptoms.  Labs Review Labs Reviewed - No data to display  Imaging Review No results found. I have personally reviewed and evaluated these images and lab results as part of my medical decision-making.   EKG Interpretation None     MDM   Final diagnoses:  Stye, right   Medications -  No data to display  49 y.o.Atticus Suniga's evaluation in the Emergency Department is complete. It has been determined that no acute conditions requiring further emergency intervention are present at this time. The patient/guardian have been advised of the diagnosis and plan. We have discussed signs and symptoms that warrant return to the ED, such as changes or worsening in symptoms.  Vital signs are stable at discharge. Filed Vitals:   11/22/14 1206  BP: 163/90  Pulse: 75  Temp: 98.4 F (36.9 C)  Resp: 14    Patient/guardian has voiced understanding and agreed to follow-up with the PCP or specialist.  I personally performed the services described in this documentation, which was scribed in my presence. The recorded information has been reviewed and is accurate.    Delos Haring, PA-C 11/22/14 [place], MD 11/22/14 [phone]

## 2014-11-22 NOTE — Discharge Instructions (Signed)

## 2014-11-22 NOTE — ED Notes (Signed)
Pt has 2 styes on right eye, one started 2 weeks ago, another started 1 week ago. Has been taking benadryl at home.

## 2015-08-19 ENCOUNTER — Emergency Department (HOSPITAL_COMMUNITY)
Admission: EM | Admit: 2015-08-19 | Discharge: 2015-08-19 | Disposition: A | Discharge status: Home / Self Care | Payer: Self-pay | Attending: Emergency Medicine | Admitting: Emergency Medicine

## 2015-08-19 ENCOUNTER — Encounter (HOSPITAL_COMMUNITY): Payer: Self-pay | Admitting: Family Medicine

## 2015-08-19 DIAGNOSIS — J45909 Unspecified asthma, uncomplicated: Secondary | ICD-10-CM | POA: Insufficient documentation

## 2015-08-19 DIAGNOSIS — F17210 Nicotine dependence, cigarettes, uncomplicated: Secondary | ICD-10-CM | POA: Insufficient documentation

## 2015-08-19 DIAGNOSIS — R21 Rash and other nonspecific skin eruption: Secondary | ICD-10-CM | POA: Insufficient documentation

## 2015-08-19 DIAGNOSIS — Z7982 Long term (current) use of aspirin: Secondary | ICD-10-CM | POA: Insufficient documentation

## 2015-08-19 MED ORDER — PREDNISONE 20 MG PO TABS: 40.0000 mg | ORAL_TABLET | Freq: Every day | ORAL | Status: DC

## 2015-08-19 MED ORDER — HYDROXYZINE HCL 10 MG PO TABS: 10.0000 mg | ORAL_TABLET | Freq: Three times a day (TID) | ORAL | Status: DC | PRN

## 2015-08-19 NOTE — ED Notes (Signed)
Declined W/C at D/C and was escorted to lobby by RN. 

## 2015-08-19 NOTE — ED Provider Notes (Signed)
CSN: [id number]     Arrival date & time 08/19/15  1740 History  By signing my name below, I, Randa Evens, attest that this documentation has been prepared under the direction and in the presence of [employer], PA-C. Electronically Signed: Randa Evens, ED Scribe. 08/19/2015. 6:09 PM.    Chief Complaint  Patient presents with  . Rash   Patient is a 50 y.o. female presenting with rash. The history is provided by the patient. No language interpreter was used.  Rash Associated symptoms: no fever and no myalgias    HPI Comments: Taylor Cook is a 50 y.o. female who presents to the Emergency Department complaining of recurrent, intermittent worsening itchy rash to her back onset 2 years prior. Pt reports that the rash has recently flared up 2 days prior. Pt denies any treatments tried PTA. Pt states she has tried Vaseline with no relief. She states that her clothing irritates the rash more. Pt denies any new medications, soaps, lotions, detergents or linens. Pt denies Iv drug use. Denies working outside or being the woods. Denies fever, chills, myalgias, abdominal pain, vomiting, difficulty breathing, numbness, tingling, weakness.  Past Medical History  Diagnosis Date  . Asthma   . Bronchitis   . Seasonal allergies    Past Surgical History  Procedure Laterality Date  . Breast surgery      removal of 2 cyst  . Tubal ligation     Family History  Problem Relation Age of Onset  . Stroke      fx  . Heart attack      fx  . Heart disease      fx  . Hypertension      fx  . Cancer      fx   Social History  Substance Use Topics  . Smoking status: Current Some Day Smoker    Types: Cigarettes  . Smokeless tobacco: None  . Alcohol Use: No   OB History    No data available      Review of Systems  Constitutional: Negative for fever and chills.  Musculoskeletal: Negative for myalgias.  Skin: Positive for rash.     Allergies  Review of patient's allergies  indicates no known allergies.  Home Medications   Prior to Admission medications   Medication Sig Start Date End Date Taking? Authorizing Provider  aspirin 81 MG tablet Take 81 mg by mouth daily.    Historical Provider, MD  erythromycin ophthalmic ointment Place a 1/2 inch ribbon of ointment into the lower eyelid for 7 days 11/22/14   Delos Haring, PA-C  hydrOXYzine (ATARAX/VISTARIL) 10 MG tablet Take 1 tablet (10 mg total) by mouth 3 (three) times daily as needed. 08/19/15   Nona Dell, PA-C  ibuprofen (ADVIL,MOTRIN) 200 MG tablet Take 200 mg by mouth every 6 (six) hours as needed for moderate pain.    Historical Provider, MD  predniSONE (DELTASONE) 20 MG tablet Take 2 tablets (40 mg total) by mouth daily. 08/19/15   Chesley Noon [place], PA-C   BP 163/95 mmHg  Pulse 84  Temp(Src) 98.1 F (36.7 C) (Oral)  Resp 18  SpO2 100%  LMP 07/26/2015   Physical Exam  Constitutional: She is oriented to person, place, and time. She appears well-developed and well-nourished.  HENT:  Head: Normocephalic and atraumatic.  Mouth/Throat: Uvula is midline, oropharynx is clear and moist and mucous membranes are normal. No oral lesions. No trismus in the jaw. No oropharyngeal exudate, posterior oropharyngeal edema, posterior oropharyngeal  erythema or tonsillar abscesses.  Eyes: Conjunctivae and EOM are normal. Right eye exhibits no discharge. Left eye exhibits no discharge. No scleral icterus.  Cardiovascular: Normal rate.   Pulmonary/Chest: Effort normal.  Neurological: She is alert and oriented to person, place, and time.  Skin:  Multiple hyperpigmented macules noted to back without any pattern. Multiple excoriation present. 4-5 small open superficial wounds noted to back where pt has scratched at macule, no induration fluctuance drainage or surrounding swelling or erythema noted.   Nursing note and vitals reviewed.   ED Course  Procedures (including critical care time) DIAGNOSTIC  STUDIES: Oxygen Saturation is 100% on RA, normal by my interpretation.    COORDINATION OF CARE: 6:09 PM-Discussed treatment plan with pt at bedside and pt agreed to plan.     Labs Review Labs Reviewed - No data to display  Imaging Review No results found.    EKG Interpretation None      MDM   Final diagnoses:  Rash   Patient presents with itchy rash to her back which she notes she has had intermittently for the past 2 years. Denies being seen any medical provider. Patient denies any difficulty breathing or swallowing. VSS. Exam revealed hyperpigmented macules with excoriations present, a few macules have been excoriated extensively resulting in a small superficial wound, no active bleeding. No signs of abscess or cellulitis. Pt has a patent airway without stridor and is handling secretions without difficulty; no angioedema. No blisters, no pustules, no warmth, no draining sinus tracts, no superficial abscesses, no bullous impetigo, no vesicles, no desquamation, no target lesions with dusky purpura or a central bulla. Not tender to touch. No concern for superimposed infection. No concern for SJS, TEN, TSS, tick borne illness, syphilis or other life-threatening condition. Will discharge home with short course of steroids, atarax and recommend Benadryl as needed for pruritis. Plan to discharge patient home with dermatology follow-up within the next week. Discussed strict return precautions with patient.  I personally performed the services described in this documentation, which was scribed in my presence. The recorded information has been reviewed and is accurate.      Chesley Noon [place], [place] 08/19/15 1844  Gareth Morgan, MD 08/20/15 1257

## 2015-08-19 NOTE — Discharge Instructions (Signed)
Take your medications as prescribed. I also recommend applying a small amount of bacitracin ointment to open wounds daily. Follow-up with the dermatology clinic listed above within the next week for further management and evaluation. Please return to the Emergency Department if symptoms worsen or new onset of fever, headache, neck stiffness, difficulty breathing, chest pain, abdominal pain, vomiting, numbness, tingling, weakness.

## 2015-08-19 NOTE — ED Notes (Signed)
Pt here with chronic rash to back. sts it flares up.

## 2016-03-25 ENCOUNTER — Emergency Department (HOSPITAL_COMMUNITY): Payer: Self-pay

## 2016-03-25 ENCOUNTER — Encounter (HOSPITAL_COMMUNITY): Payer: Self-pay

## 2016-03-25 ENCOUNTER — Emergency Department (HOSPITAL_COMMUNITY)
Admission: EM | Admit: 2016-03-25 | Discharge: 2016-03-25 | Disposition: A | Discharge status: Home / Self Care | Payer: Self-pay | Attending: Emergency Medicine | Admitting: Emergency Medicine

## 2016-03-25 DIAGNOSIS — Z79899 Other long term (current) drug therapy: Secondary | ICD-10-CM | POA: Insufficient documentation

## 2016-03-25 DIAGNOSIS — Z7982 Long term (current) use of aspirin: Secondary | ICD-10-CM | POA: Insufficient documentation

## 2016-03-25 DIAGNOSIS — J449 Chronic obstructive pulmonary disease, unspecified: Secondary | ICD-10-CM | POA: Insufficient documentation

## 2016-03-25 DIAGNOSIS — R1032 Left lower quadrant pain: Secondary | ICD-10-CM | POA: Insufficient documentation

## 2016-03-25 DIAGNOSIS — F17210 Nicotine dependence, cigarettes, uncomplicated: Secondary | ICD-10-CM | POA: Insufficient documentation

## 2016-03-25 LAB — BASIC METABOLIC PANEL
ANION GAP: 9 (ref 5–15)
BUN: 10 mg/dL (ref 6–20)
CALCIUM: 9.3 mg/dL (ref 8.9–10.3)
CO2: 24 mmol/L (ref 22–32)
Chloride: 103 mmol/L (ref 101–111)
Creatinine, Ser: 0.78 mg/dL (ref 0.44–1.00)
GFR calc Af Amer: >60 mL/min (ref >60)
GFR calc non Af Amer: >60 mL/min (ref >60)
GLUCOSE: 80 mg/dL (ref 65–99)
Potassium: 3.7 mmol/L (ref 3.5–5.1)
Sodium: 136 mmol/L (ref 135–145)

## 2016-03-25 LAB — URINALYSIS, ROUTINE W REFLEX MICROSCOPIC
BILIRUBIN URINE: NEGATIVE
Glucose, UA: NEGATIVE mg/dL
HGB URINE DIPSTICK: NEGATIVE
Ketones, ur: 5 mg/dL — AB
Leukocytes, UA: NEGATIVE
Nitrite: NEGATIVE
PH: 6 (ref 5.0–8.0)
Protein, ur: NEGATIVE mg/dL
Specific Gravity, Urine: 1.021 (ref 1.005–1.030)

## 2016-03-25 LAB — I-STAT BETA HCG BLOOD, ED (MC, WL, AP ONLY): I-stat hCG, quantitative: <5 m[IU]/mL (ref <5)

## 2016-03-25 LAB — CBC
HCT: 37.8 % (ref 36.0–46.0)
Hemoglobin: 12.2 g/dL (ref 12.0–15.0)
MCH: 26 pg (ref 26.0–34.0)
MCHC: 32.3 g/dL (ref 30.0–36.0)
MCV: 80.6 fL (ref 78.0–100.0)
Platelets: 261 10*3/uL (ref 150–400)
RBC: 4.69 MIL/uL (ref 3.87–5.11)
RDW: 19.9 % — AB (ref 11.5–15.5)
WBC: 4.1 10*3/uL (ref 4.0–10.5)

## 2016-03-25 LAB — POC URINE PREG, ED: Preg Test, Ur: NEGATIVE

## 2016-03-25 MED ORDER — IOPAMIDOL (ISOVUE-300) INJECTION 61%
INTRAVENOUS | Status: AC
  Administered 2016-03-25: 100 mL
  Filled 2016-03-25: qty 100

## 2016-03-25 MED ORDER — MORPHINE SULFATE (PF) 4 MG/ML IV SOLN
4.0000 mg | Freq: Once | INTRAVENOUS | Status: AC
  Administered 2016-03-25: 4 mg via INTRAVENOUS
  Filled 2016-03-25: qty 1

## 2016-03-25 MED ORDER — SODIUM CHLORIDE 0.9 % IV BOLUS (SEPSIS)
500.0000 mL | Freq: Once | INTRAVENOUS | Status: AC
  Administered 2016-03-25: 500 mL via INTRAVENOUS

## 2016-03-25 NOTE — ED Provider Notes (Signed)
[facility] DEPT Provider Note   CSN: [id number] Arrival date & time: 03/25/16  1115     History   Chief Complaint Chief Complaint  Patient presents with  . cough/pelvic pain    HPI Taylor Cook is a 51 y.o. female presenting with 1 week of worsening left lower quadrant abdominal pain and cough, congestion. She describes her left lower quadrant pain as localized to the area, pressure, 8/10 pain. Patient states that she is having had anything like this before. Patient also complains of cough, congestion and chills that started around the same time last week. Patient reports associated shortness of breath and headache with her cough. Patient describes her headache as right sided and pressure 5/10. Patient denies visual disturbances, changes in ambulation, neck pain, neck stiffness Patient has tried ibuprofen and Tylenol with little to no relief. Patient reports decreased appetite since yesterday but not having any trouble swallowing. Patient able to drink fluids without difficulty. Patient denies nausea, vomiting, documented fevers, chest pain, urinary symptoms, changes in bowel movements.  The history is provided by the patient. No language interpreter was used.    Past Medical History:  Diagnosis Date  . Asthma   . Bronchitis   . Seasonal allergies     Patient Active Problem List   Diagnosis Date Noted  . Asthma 01/12/2014  . COPD (chronic obstructive pulmonary disease) ([place]) 01/12/2014  . Chest pain 01/12/2014  . Family history of premature CAD 01/12/2014  . Smoking 01/12/2014    Past Surgical History:  Procedure Laterality Date  . BREAST SURGERY     removal of 2 cyst  . TUBAL LIGATION      OB History    No data available       Home Medications    Prior to Admission medications   Medication Sig Start Date End Date Taking? Authorizing Provider  acetaminophen (TYLENOL) 500 MG tablet Take 1,000 mg by mouth every 6 (six) hours as needed for mild pain or fever.    Yes Historical Provider, MD  Phenyleph-CPM-DM-APAP (ALKA-SELTZER PLUS COLD & COUGH) 07-09-08-325 MG CAPS Take 1-2 capsules by mouth daily as needed (cough).   Yes Historical Provider, MD  aspirin 81 MG tablet Take 81 mg by mouth daily.    Historical Provider, MD  erythromycin ophthalmic ointment Place a 1/2 inch ribbon of ointment into the lower eyelid for 7 days Patient not taking: Reported on 03/25/2016 11/22/14   Delos Haring, PA-C  hydrOXYzine (ATARAX/VISTARIL) 10 MG tablet Take 1 tablet (10 mg total) by mouth 3 (three) times daily as needed. Patient not taking: Reported on 03/25/2016 08/19/15   Nona Dell, PA-C  ibuprofen (ADVIL,MOTRIN) 200 MG tablet Take 200 mg by mouth every 6 (six) hours as needed for moderate pain.    Historical Provider, MD  predniSONE (DELTASONE) 20 MG tablet Take 2 tablets (40 mg total) by mouth daily. Patient not taking: Reported on 03/25/2016 08/19/15   Nona Dell, PA-C    Family History Family History  Problem Relation Age of Onset  . Stroke      fx  . Heart attack      fx  . Heart disease      fx  . Hypertension      fx  . Cancer      fx    Social History Social History  Substance Use Topics  . Smoking status: Current Some Day Smoker    Types: Cigarettes  . Smokeless tobacco: Not on file  .  Alcohol use No     Allergies   Patient has no known allergies.   Review of Systems Review of Systems  Constitutional: Positive for appetite change, chills and diaphoresis. Negative for fever.  HENT: Positive for congestion. Negative for sore throat and trouble swallowing.   Eyes: Negative for pain and visual disturbance.  Respiratory: Positive for cough and shortness of breath. Negative for wheezing.   Cardiovascular: Negative for chest pain and palpitations.  Gastrointestinal: Negative for abdominal pain, constipation, diarrhea, nausea and vomiting.  Genitourinary: Negative for difficulty urinating, dysuria and hematuria.    Musculoskeletal: Negative for arthralgias, back pain, neck pain and neck stiffness.  Skin: Negative for color change, rash and wound.  Neurological: Positive for weakness. Negative for syncope and numbness.     Physical Exam Updated Vital Signs BP 132/73   Pulse 66   Temp 98.2 F (36.8 C) (Oral)   Resp 20   SpO2 97%   Physical Exam  Constitutional: She is oriented to person, place, and time. She appears well-developed and well-nourished.  Uncomfortable  HENT:  Head: Normocephalic and atraumatic.  Right Ear: External ear normal.  Left Ear: External ear normal.  Nose: Nose normal.  Mouth/Throat: Oropharynx is clear and moist. No oropharyngeal exudate.  Eyes: EOM are normal. Pupils are equal, round, and reactive to light.  Neck: Normal range of motion. Neck supple.  Cardiovascular: Normal rate and regular rhythm.   Pulmonary/Chest: Effort normal and breath sounds normal. No respiratory distress. She has no wheezes.  Abdominal: Soft. There is tenderness (LLQ). There is guarding. There is no rebound. No hernia.  Neurological: She is alert and oriented to person, place, and time.  Cranial Nerves:  III,IV, VI: ptosis not present, extra-ocular movements intact bilaterally, direct and consensual pupillary light reflexes intact bilaterally V: facial sensation, jaw opening, and bite strength equal bilaterally VII: eyebrow raise, eyelid close, smile, frown, pucker equal bilaterally VIII: hearing grossly normal bilaterally  IX,X: palate elevation and swallowing intact XI: bilateral shoulder shrug and lateral head rotation equal and strong XII: midline tongue extension  Cerebellar tests negative.  Sensory intact.  Muscle strength 5/5 Patient able to ambulate without difficulty.   Skin: Skin is warm.  Psychiatric: She has a normal mood and affect. Her behavior is normal.  Nursing note and vitals reviewed.    ED Treatments / Results  Labs (all labs ordered are listed, but only  abnormal results are displayed) Labs Reviewed  CBC - Abnormal; Notable for the following:       Result Value   RDW 19.9 (*)    All other components within normal limits  BASIC METABOLIC PANEL  URINALYSIS, ROUTINE W REFLEX MICROSCOPIC  POC URINE PREG, ED    EKG  EKG Interpretation None       Radiology No results found.  Procedures Procedures (including critical care time)  Medications Ordered in ED Medications  sodium chloride 0.9 % bolus 500 mL (0 mLs Intravenous Stopped 03/25/16 1512)  morphine 4 MG/ML injection 4 mg (4 mg Intravenous Given 03/25/16 1327)  morphine 4 MG/ML injection 4 mg (4 mg Intravenous Given 03/25/16 1511)     Initial Impression / Assessment and Plan / ED Course  I have reviewed the triage vital signs and the nursing notes.  Pertinent labs & imaging results that were available during my care of the patient were reviewed by me and considered in my medical decision making (see chart for details).  Clinical Course   Patient is a 51  yo female presents with LLQ abdominal pain x 1 week. On exam, clammy skin, afebrile, VSS and in no apparent distress. Patient's pain and other symptoms adequately managed in emergency department.  52ml Fluid bolus given. Labs and vitals reviewed. CBC and BMP are unremarkable.  Awaiting pregnancy test, urinalysis, and Ct.    At shift change care was transferred to Taylor Echevaria, PA-C who will follow pending studies, re-evaulate and determine disposition.    Final Clinical Impressions(s) / ED Diagnoses   Final diagnoses:  None    New Prescriptions New Prescriptions   No medications on file     [place], [place] 03/25/16 [place], MD 03/26/16 [phone]

## 2016-03-25 NOTE — ED Triage Notes (Signed)
Patient here  With 1 week of LLQ abdominal pain with cough, congestion and fever. Taking otc meds with no relief. Alert and oriented, denies urinary symptoms.

## 2016-03-25 NOTE — ED Provider Notes (Signed)
Care assumed from Dr. Kathrynn Humble and PA Kemper Durie Espina at 4:00pm with plan for discharge home if Ct negative for diverticulitis.   Results:  BP 147/80   Pulse 65   Temp 98.2 F (36.8 C) (Oral)   Resp 18   LMP 12/18/2015   SpO2 97%   Results for orders placed or performed during the hospital encounter of [id number]  Basic metabolic panel  Result Value Ref Range   Sodium 136 135 - 145 mmol/L   Potassium 3.7 3.5 - 5.1 mmol/L   Chloride 103 101 - 111 mmol/L   CO2 24 22 - 32 mmol/L   Glucose, Bld 80 65 - 99 mg/dL   BUN 10 6 - 20 mg/dL   Creatinine, Ser 0.78 0.44 - 1.00 mg/dL   Calcium 9.3 8.9 - 10.3 mg/dL   GFR calc non Af Amer >60 >60 mL/min   GFR calc Af Amer >60 >60 mL/min   Anion gap 9 5 - 15  CBC  Result Value Ref Range   WBC 4.1 4.0 - 10.5 K/uL   RBC 4.69 3.87 - 5.11 MIL/uL   Hemoglobin 12.2 12.0 - 15.0 g/dL   HCT 37.8 36.0 - 46.0 %   MCV 80.6 78.0 - 100.0 fL   MCH 26.0 26.0 - 34.0 pg   MCHC 32.3 30.0 - 36.0 g/dL   RDW 19.9 (H) 11.5 - 15.5 %   Platelets 261 150 - 400 K/uL  Urinalysis, Routine w reflex microscopic  Result Value Ref Range   Color, Urine YELLOW YELLOW   APPearance CLEAR CLEAR   Specific Gravity, Urine 1.021 1.005 - 1.030   pH 6.0 5.0 - 8.0   Glucose, UA NEGATIVE NEGATIVE mg/dL   Hgb urine dipstick NEGATIVE NEGATIVE   Bilirubin Urine NEGATIVE NEGATIVE   Ketones, ur 5 (A) NEGATIVE mg/dL   Protein, ur NEGATIVE NEGATIVE mg/dL   Nitrite NEGATIVE NEGATIVE   Leukocytes, UA NEGATIVE NEGATIVE  POC urine preg, ED  Result Value Ref Range   Preg Test, Ur NEGATIVE NEGATIVE  I-Stat Beta hCG blood, ED ([facility], [facility], [facility] only)  Result Value Ref Range   I-stat hCG, quantitative <5.0 <5 mIU/mL   Comment 3            Ct Abdomen Pelvis W Contrast  Result Date: 03/25/2016 CLINICAL DATA:  LEFT lower quadrant pain for 10 days. EXAM: CT ABDOMEN AND PELVIS WITH CONTRAST TECHNIQUE: Multidetector CT imaging of the abdomen and pelvis was performed using the standard  protocol following bolus administration of intravenous contrast. CONTRAST:  100 cc ISOVUE-300 IOPAMIDOL (ISOVUE-300) INJECTION 61% COMPARISON:  None. FINDINGS: LOWER CHEST: Multifocal atelectasis/scarring. 3 mm sub solid LEFT lower lobe pulmonary nodule below size followup recommendations. 2 mm RIGHT lower lobe sub pleural pulmonary nodules likely benign. HEPATOBILIARY: Liver and gallbladder are normal. PANCREAS: Normal. SPLEEN: Normal. ADRENALS/URINARY TRACT: Kidneys are orthotopic, demonstrating symmetric enhancement. No nephrolithiasis, hydronephrosis or solid renal masses. 13 mm LEFT lower pole renal cyst. The unopacified ureters are normal in course and caliber. Delayed imaging through the kidneys demonstrates symmetric prompt contrast excretion within the proximal urinary collecting system. Urinary bladder is partially distended and unremarkable. Normal adrenal glands. STOMACH/BOWEL: The stomach, small and large bowel are normal in course and caliber without inflammatory changes. Moderate amount of retained large bowel stool. Normal appendix. VASCULAR/LYMPHATIC: Aortoiliac vessels are normal in course and caliber. No lymphadenopathy by CT size criteria. 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 3 cm benign-appearing LEFT adnexal cyst, no indicated follow-up. 1  cm coarse calcification LEFT adnexae. OTHER: No intraperitoneal free fluid or free air. MUSCULOSKELETAL: Nonacute.  Small fat containing umbilical hernia. IMPRESSION: Moderate amount of retained large bowel stool without bowel obstruction or acute intra-abdominal/ pelvic process. Electronically Signed   By: Elon Alas M.D.   On: 03/25/2016 18:36    Radiology and laboratory examinations were reviewed by me and used in medical decision making if performed.   MDM:  Plan to follow up with PCP as needed and return precautions discussed for worsening or new concerning symptoms.   Diagnoses that have been ruled out:  None   Diagnoses that are still under consideration:  None  Final diagnoses:  Left lower quadrant pain      Emeline General, PA-C 03/25/16 1926    Leo Grosser, MD 03/26/16 [phone]

## 2016-03-25 NOTE — ED Notes (Signed)
Patient transported to CT 

## 2016-04-24 ENCOUNTER — Encounter (HOSPITAL_COMMUNITY): Payer: Self-pay | Admitting: Emergency Medicine

## 2016-04-24 ENCOUNTER — Emergency Department (HOSPITAL_COMMUNITY)
Admission: EM | Admit: 2016-04-24 | Discharge: 2016-04-24 | Disposition: A | Discharge status: Home / Self Care | Payer: Self-pay | Attending: Emergency Medicine | Admitting: Emergency Medicine

## 2016-04-24 DIAGNOSIS — F17210 Nicotine dependence, cigarettes, uncomplicated: Secondary | ICD-10-CM | POA: Insufficient documentation

## 2016-04-24 DIAGNOSIS — K0889 Other specified disorders of teeth and supporting structures: Secondary | ICD-10-CM | POA: Insufficient documentation

## 2016-04-24 DIAGNOSIS — Z7982 Long term (current) use of aspirin: Secondary | ICD-10-CM | POA: Insufficient documentation

## 2016-04-24 DIAGNOSIS — J449 Chronic obstructive pulmonary disease, unspecified: Secondary | ICD-10-CM | POA: Insufficient documentation

## 2016-04-24 MED ORDER — OXYCODONE-ACETAMINOPHEN 5-325 MG PO TABS: 1.0000 | ORAL_TABLET | ORAL | 0 refills | Status: DC | PRN

## 2016-04-24 MED ORDER — PENICILLIN V POTASSIUM 500 MG PO TABS: 500.0000 mg | ORAL_TABLET | Freq: Two times a day (BID) | ORAL | 0 refills | Status: AC

## 2016-04-24 MED ORDER — IBUPROFEN 800 MG PO TABS: 800.0000 mg | ORAL_TABLET | Freq: Three times a day (TID) | ORAL | 0 refills | Status: DC

## 2016-04-24 MED ORDER — OXYCODONE-ACETAMINOPHEN 5-325 MG PO TABS
1.0000 | ORAL_TABLET | Freq: Once | ORAL | Status: AC
  Administered 2016-04-24: 1 via ORAL
  Filled 2016-04-24: qty 1

## 2016-04-24 NOTE — ED Notes (Signed)
Pt states she understands instructions. Home stable. With family

## 2016-04-24 NOTE — ED Triage Notes (Signed)
Left sided dental pain since  Monday, now some swelling has dentist appointment but not till next week, states broken teeth

## 2016-04-24 NOTE — Discharge Instructions (Signed)
Take medications as prescribed. You may also take 600 mg ibuprofen 4 times daily as needed for pain relief. I recommend eating prior to taking ibuprofen to prevent gastrointestinal side effects. You may apply ice to affected area for 15-20 minutes 3-4 times daily to help with pain. °Follow-up with one of the dental clinics listed below for further management of your dental pain. °Return to the emergency department if symptoms worsen or new onset of fever, headache, neck stiffness, facial/neck swelling, unable to open jaw, unable to swallow resulting in drooling, difficulty breathing, drainage, unable to tolerate fluids.  ° °[employer] °[employer] °[facility] °[address] °[place], [place] [address] °Phone [phone] ° °[facility] [employer] [facility] in [place], [place], exemplifies the Dental School?s vision to improve the health and quality of life of all [place]ians by creating leaders with a passion to care for the underserved and by leading the nation in community-based, service learning oral health education. ° °We are committed to offering comprehensive general dental services for adults, children and special needs patients in a safe, caring and professional setting. ° ° °Appointments: Our clinic is open Monday through Friday 8:00 a.m. until 5:00 p.m. The amount of time scheduled for an appointment depends on the patient?s specific needs. We ask that you keep your appointed time for care or provide 24-hour notice of all appointment changes. Parents or legal guardians must accompany minor children. °  °Payment for Services: Medicaid and other insurance plans are welcome. Payment for services is due when services are rendered and may be made by cash or credit card. If you have dental insurance, we will assist you with your claim submission. °   °Emergencies:   Emergency services will be provided Monday through Friday on a walk-in basis.  Please arrive early for emergency services. After hours emergency services will be provided for patients of record as required. °  °Services:  °Comprehensive General Dentistry °Children?s Dentistry °Oral Surgery - Extractions °Root Canals °Sealants and Tooth Colored Fillings °Crowns and Bridges °Dentures and Partial Dentures °Implant Services °Periodontal Services and Cleanings °Cosmetic Tooth Whitening °Digital Radiography °3-D/[facility]  °

## 2016-04-24 NOTE — ED Provider Notes (Signed)
[facility] DEPT Provider Note   CSN: [id number] Arrival date & time: 04/24/16  1129  By signing my name below, I, Sonum Patel, attest that this documentation has been prepared under the direction and in the presence of Harlene Ramus, [place]. Electronically Signed: Sonum Patel, Education administrator. 04/24/16. 12:28 PM.  History   Chief Complaint Chief Complaint  Patient presents with  . Dental Pain    The history is provided by the patient. No language interpreter was used.     HPI Comments: Taylor Cook is a 51 y.o. female who presents to the Emergency Department complaining of constant, gradually worsened right upper and lower dental pain with associated swelling to the right face  that began 3 days ago. She has a dentist appointment next month as that is the earliest they can see her. She has tried Tylenol without relief. She has applied ice with minimal relief. She denies fever, gum drainage, trismus, drooling, vomiting, SOB, wheezing.    Past Medical History:  Diagnosis Date  . Asthma   . Bronchitis   . Seasonal allergies     Patient Active Problem List   Diagnosis Date Noted  . Asthma 01/12/2014  . COPD (chronic obstructive pulmonary disease) ([place]) 01/12/2014  . Chest pain 01/12/2014  . Family history of premature CAD 01/12/2014  . Smoking 01/12/2014    Past Surgical History:  Procedure Laterality Date  . BREAST SURGERY     removal of 2 cyst  . TUBAL LIGATION      OB History    No data available       Home Medications    Prior to Admission medications   Medication Sig Start Date End Date Taking? Authorizing Provider  acetaminophen (TYLENOL) 500 MG tablet Take 1,000 mg by mouth every 6 (six) hours as needed for mild pain or fever.    Historical Provider, MD  aspirin 81 MG tablet Take 81 mg by mouth daily.    Historical Provider, MD  erythromycin ophthalmic ointment Place a 1/2 inch ribbon of ointment into the lower eyelid for 7 days Patient not taking: Reported on  03/25/2016 11/22/14   Delos Haring, PA-C  hydrOXYzine (ATARAX/VISTARIL) 10 MG tablet Take 1 tablet (10 mg total) by mouth 3 (three) times daily as needed. Patient not taking: Reported on 03/25/2016 08/19/15   Nona Dell, PA-C  ibuprofen (ADVIL,MOTRIN) 800 MG tablet Take 1 tablet (800 mg total) by mouth 3 (three) times daily. 04/24/16   Nona Dell, PA-C  oxyCODONE-acetaminophen (PERCOCET/ROXICET) 5-325 MG tablet Take 1 tablet by mouth every 4 (four) hours as needed for severe pain. 04/24/16   Nona Dell, PA-C  penicillin v potassium (VEETID) 500 MG tablet Take 1 tablet (500 mg total) by mouth 2 (two) times daily. 04/24/16 05/01/16  Nona Dell, PA-C  Phenyleph-CPM-DM-APAP (ALKA-SELTZER PLUS COLD & COUGH) 07-09-08-325 MG CAPS Take 1-2 capsules by mouth daily as needed (cough).    Historical Provider, MD  predniSONE (DELTASONE) 20 MG tablet Take 2 tablets (40 mg total) by mouth daily. Patient not taking: Reported on 03/25/2016 08/19/15   Nona Dell, PA-C    Family History Family History  Problem Relation Age of Onset  . Stroke      fx  . Heart attack      fx  . Heart disease      fx  . Hypertension      fx  . Cancer      fx    Social History Social History  Substance Use Topics  . Smoking status: Current Some Day Smoker    Types: Cigarettes  . Smokeless tobacco: Never Used  . Alcohol use No     Allergies   Patient has no known allergies.   Review of Systems Review of Systems  Constitutional: Negative for fever.  HENT: Positive for dental problem and facial swelling.   Respiratory: Negative for shortness of breath.      Physical Exam Updated Vital Signs BP 195/95 (BP Location: Right Arm)   Pulse 87   Temp 98.5 F (36.9 C) (Oral)   Resp 20   SpO2 100%   Physical Exam  Constitutional: She is oriented to person, place, and time. She appears well-developed and well-nourished.  HENT:  Head: Normocephalic and  atraumatic.  Mouth/Throat: Oropharynx is clear and moist. Abnormal dentition. Dental caries present. No dental abscesses. No oropharyngeal exudate, posterior oropharyngeal edema, posterior oropharyngeal erythema or tonsillar abscesses.  Partially cracked tooth #1 with tenderness to palpation. Mild tenderness around tooth #2. No swelling, erythema, induration, fluctuance, or drainage to surrounding gingiva. Poor dentition throughout with multiple decaying and extracted teeth. No trismus, drooling, facial/neck swelling or stridor on exam. No muffled voice. Floor of mouth soft.   Eyes: Conjunctivae and EOM are normal. Right eye exhibits no discharge. Left eye exhibits no discharge. No scleral icterus.  Neck: Normal range of motion. Neck supple.  Cardiovascular: Normal rate.   Pulmonary/Chest: Effort normal.  Neurological: She is alert and oriented to person, place, and time.  Skin: Skin is warm and dry.  Psychiatric: She has a normal mood and affect.  Nursing note and vitals reviewed.    ED Treatments / Results  DIAGNOSTIC STUDIES: Oxygen Saturation is 100% on RA, normal by my interpretation.    COORDINATION OF CARE: 12:27 PM Discussed treatment plan with pt at bedside and pt agreed to plan.   Labs (all labs ordered are listed, but only abnormal results are displayed) Labs Reviewed - No data to display  EKG  EKG Interpretation None       Radiology No results found.  Procedures Procedures (including critical care time)  Medications Ordered in ED Medications  oxyCODONE-acetaminophen (PERCOCET/ROXICET) 5-325 MG per tablet 1 tablet (1 tablet Oral Given 04/24/16 1318)     Initial Impression / Assessment and Plan / ED Course  I have reviewed the triage vital signs and the nursing notes.  Pertinent labs & imaging results that were available during my care of the patient were reviewed by me and considered in my medical decision making (see chart for details).     Checked [place]  Controlled Substance Reporting System and patient does not have any recorded prescriptions or dispenses.   Patient with dentalgia.  No abscess requiring immediate incision and drainage.  Exam not concerning for Ludwig's angina or pharyngeal abscess.  Will treat with penicillin, NSAIDs and pain meds. Pt instructed to follow-up with dentist.  Pt given dental resources at d/c. Discussed return precautions. Pt safe for discharge.  Final Clinical Impressions(s) / ED Diagnoses   Final diagnoses:  Pain, dental    New Prescriptions New Prescriptions   IBUPROFEN (ADVIL,MOTRIN) 800 MG TABLET    Take 1 tablet (800 mg total) by mouth 3 (three) times daily.   OXYCODONE-ACETAMINOPHEN (PERCOCET/ROXICET) 5-325 MG TABLET    Take 1 tablet by mouth every 4 (four) hours as needed for severe pain.   PENICILLIN V POTASSIUM (VEETID) 500 MG TABLET    Take 1 tablet (500 mg total) by mouth  2 (two) times daily.   I personally performed the services described in this documentation, which was scribed in my presence. The recorded information has been reviewed and is accurate.    Chesley Noon [place], [place] 04/24/16 [place], MD 04/24/16 [phone]

## 2017-03-27 ENCOUNTER — Other Ambulatory Visit: Payer: Self-pay

## 2017-03-27 ENCOUNTER — Emergency Department (HOSPITAL_COMMUNITY)
Admission: EM | Admit: 2017-03-27 | Discharge: 2017-03-27 | Disposition: A | Discharge status: Home / Self Care | Payer: Self-pay | Attending: Emergency Medicine | Admitting: Emergency Medicine

## 2017-03-27 ENCOUNTER — Encounter (HOSPITAL_COMMUNITY): Payer: Self-pay | Admitting: Emergency Medicine

## 2017-03-27 DIAGNOSIS — J449 Chronic obstructive pulmonary disease, unspecified: Secondary | ICD-10-CM | POA: Insufficient documentation

## 2017-03-27 DIAGNOSIS — W57XXXA Bitten or stung by nonvenomous insect and other nonvenomous arthropods, initial encounter: Secondary | ICD-10-CM | POA: Insufficient documentation

## 2017-03-27 DIAGNOSIS — R21 Rash and other nonspecific skin eruption: Secondary | ICD-10-CM | POA: Insufficient documentation

## 2017-03-27 DIAGNOSIS — J45909 Unspecified asthma, uncomplicated: Secondary | ICD-10-CM | POA: Insufficient documentation

## 2017-03-27 DIAGNOSIS — F17210 Nicotine dependence, cigarettes, uncomplicated: Secondary | ICD-10-CM | POA: Insufficient documentation

## 2017-03-27 DIAGNOSIS — Z7982 Long term (current) use of aspirin: Secondary | ICD-10-CM | POA: Insufficient documentation

## 2017-03-27 DIAGNOSIS — Z79899 Other long term (current) drug therapy: Secondary | ICD-10-CM | POA: Insufficient documentation

## 2017-03-27 NOTE — ED Notes (Signed)
EDP at bedside  

## 2017-03-27 NOTE — Discharge Instructions (Signed)
Use topical hydrocortisone to area of rash.  Benadryl for itching

## 2017-03-27 NOTE — ED Triage Notes (Signed)
Rash on neck, leg and  Chest x 3 days , rash is itchy has been using alcohol

## 2017-03-27 NOTE — ED Provider Notes (Signed)
[facility] EMERGENCY DEPARTMENT Provider Note   CSN: [id number] Arrival date & time: 03/27/17  1148     History   Chief Complaint Chief Complaint  Patient presents with  . Rash    HPI Taylor Cook is a 52 y.o. female.  The history is provided by the patient.  Rash   This is a new problem. The current episode started more than 2 days ago. The problem has not changed since onset.The problem is associated with nothing. There has been no fever.    Past Medical History:  Diagnosis Date  . Asthma   . Bronchitis   . Seasonal allergies     Patient Active Problem List   Diagnosis Date Noted  . Asthma 01/12/2014  . COPD (chronic obstructive pulmonary disease) ([place]) 01/12/2014  . Chest pain 01/12/2014  . Family history of premature CAD 01/12/2014  . Smoking 01/12/2014    Past Surgical History:  Procedure Laterality Date  . BREAST SURGERY     removal of 2 cyst  . TUBAL LIGATION      OB History    No data available       Home Medications    Prior to Admission medications   Medication Sig Start Date End Date Taking? Authorizing Provider  acetaminophen (TYLENOL) 500 MG tablet Take 1,000 mg by mouth every 6 (six) hours as needed for mild pain or fever.    [provider]  aspirin 81 MG tablet Take 81 mg by mouth daily.    [provider]  erythromycin ophthalmic ointment Place a 1/2 inch ribbon of ointment into the lower eyelid for 7 days Patient not taking: Reported on 03/25/2016 11/22/14   Delos Haring, PA-C  hydrOXYzine (ATARAX/VISTARIL) 10 MG tablet Take 1 tablet (10 mg total) by mouth 3 (three) times daily as needed. Patient not taking: Reported on 03/25/2016 08/19/15   Nona Dell, PA-C  ibuprofen (ADVIL,MOTRIN) 800 MG tablet Take 1 tablet (800 mg total) by mouth 3 (three) times daily. 04/24/16   Nona Dell, PA-C  oxyCODONE-acetaminophen (PERCOCET/ROXICET) 5-325 MG tablet Take 1 tablet by mouth every 4  (four) hours as needed for severe pain. 04/24/16   Nona Dell, PA-C  Phenyleph-CPM-DM-APAP (ALKA-SELTZER PLUS COLD & COUGH) 07-09-08-325 MG CAPS Take 1-2 capsules by mouth daily as needed (cough).    [provider]  predniSONE (DELTASONE) 20 MG tablet Take 2 tablets (40 mg total) by mouth daily. Patient not taking: Reported on 03/25/2016 08/19/15   Nona Dell, PA-C    Family History Family History  Problem Relation Age of Onset  . Stroke Unknown        fx  . Heart attack Unknown        fx  . Heart disease Unknown        fx  . Hypertension Unknown        fx  . Cancer Unknown        fx    Social History Social History   Tobacco Use  . Smoking status: Current Some Day Smoker    Types: Cigarettes  . Smokeless tobacco: Never Used  Substance Use Topics  . Alcohol use: No    Alcohol/week: 0.0 oz  . Drug use: No     Allergies   Patient has no known allergies.   Review of Systems Review of Systems  Constitutional: Negative for fever.  Skin: Positive for rash.  All other systems reviewed and are negative.  Physical Exam Updated Vital Signs BP 124/89 (BP Location: Right Arm)   Pulse 89   Temp 98.6 F (37 C) (Oral)   Resp 16   SpO2 99%   Physical Exam  Constitutional: She is oriented to person, place, and time. She appears well-developed and well-nourished.  HENT:  Head: Normocephalic.  Eyes: EOM are normal.  Neck: Normal range of motion.  Pulmonary/Chest: Effort normal.  Abdominal: She exhibits no distension.  Musculoskeletal: Normal range of motion.  Neurological: She is alert and oriented to person, place, and time.  Skin: Skin is warm.  Multiple red raised areas, right chest and right leg, areas appear to be bites  Psychiatric: She has a normal mood and affect.  Nursing note and vitals reviewed.    ED Treatments / Results  Labs (all labs ordered are listed, but only abnormal results are displayed) Labs Reviewed -  No data to display  EKG  EKG Interpretation None       Radiology No results found.  Procedures Procedures (including critical care time)  Medications Ordered in ED Medications - No data to display   Initial Impression / Assessment and Plan / ED Course  I have reviewed the triage vital signs and the nursing notes.  Pertinent labs & imaging results that were available during my care of the patient were reviewed by me and considered in my medical decision making (see chart for details).     Pt counseled I suspect flea or bedbug bites,  Apt advised to look for bedbugs.  Benadryl for itching,  Topical hydrocortisone for bites  Final Clinical Impressions(s) / ED Diagnoses   Final diagnoses:  Insect bite, initial encounter    ED Discharge Orders    None    An After Visit Summary was printed and given to the patient.   Fransico Meadow, [place] 03/27/17 1240    Valarie Merino, MD 03/28/17 1349

## 2017-07-05 ENCOUNTER — Emergency Department (HOSPITAL_COMMUNITY)
Admission: EM | Admit: 2017-07-05 | Discharge: 2017-07-05 | Disposition: A | Discharge status: Home / Self Care | Payer: Self-pay | Attending: Emergency Medicine | Admitting: Emergency Medicine

## 2017-07-05 ENCOUNTER — Encounter (HOSPITAL_COMMUNITY): Payer: Self-pay | Admitting: Emergency Medicine

## 2017-07-05 ENCOUNTER — Other Ambulatory Visit: Payer: Self-pay

## 2017-07-05 ENCOUNTER — Emergency Department (HOSPITAL_COMMUNITY): Payer: Self-pay

## 2017-07-05 DIAGNOSIS — J45909 Unspecified asthma, uncomplicated: Secondary | ICD-10-CM | POA: Insufficient documentation

## 2017-07-05 DIAGNOSIS — R51 Headache: Secondary | ICD-10-CM | POA: Insufficient documentation

## 2017-07-05 DIAGNOSIS — R05 Cough: Secondary | ICD-10-CM | POA: Insufficient documentation

## 2017-07-05 DIAGNOSIS — J189 Pneumonia, unspecified organism: Secondary | ICD-10-CM

## 2017-07-05 DIAGNOSIS — J329 Chronic sinusitis, unspecified: Secondary | ICD-10-CM | POA: Insufficient documentation

## 2017-07-05 DIAGNOSIS — J181 Lobar pneumonia, unspecified organism: Secondary | ICD-10-CM | POA: Insufficient documentation

## 2017-07-05 DIAGNOSIS — R509 Fever, unspecified: Secondary | ICD-10-CM | POA: Insufficient documentation

## 2017-07-05 DIAGNOSIS — J449 Chronic obstructive pulmonary disease, unspecified: Secondary | ICD-10-CM | POA: Insufficient documentation

## 2017-07-05 DIAGNOSIS — Z7982 Long term (current) use of aspirin: Secondary | ICD-10-CM | POA: Insufficient documentation

## 2017-07-05 DIAGNOSIS — F17210 Nicotine dependence, cigarettes, uncomplicated: Secondary | ICD-10-CM | POA: Insufficient documentation

## 2017-07-05 MED ORDER — GUAIFENESIN-CODEINE 100-10 MG/5ML PO SOLN: 5.0000 mL | Freq: Four times a day (QID) | ORAL | 0 refills | Status: DC | PRN

## 2017-07-05 MED ORDER — DOXYCYCLINE HYCLATE 100 MG PO CAPS: 100.0000 mg | ORAL_CAPSULE | Freq: Two times a day (BID) | ORAL | 0 refills | Status: DC

## 2017-07-05 NOTE — ED Triage Notes (Signed)
Pt to ER for body aches, nasal congestion, and sinus pressure with yellow sputum- productive cough x1 week. Pt in NAD. States has been taking Claritin with no relief. Also reports eye drainage.

## 2017-07-05 NOTE — ED Provider Notes (Signed)
[facility] EMERGENCY DEPARTMENT Provider Note   CSN: [id number] Arrival date & time: 07/05/17  3382     History   Chief Complaint Chief Complaint  Patient presents with  . URI    HPI Taylor Cook is a 52 y.o. female who presents the emergency department for evaluation of URI symptoms.  She had onset of symptoms about 5 days ago.  She started with runny nose sinus pressure headaches fevers and chills.  She has had progressive worsening of her symptoms and now has a painful productive cough.  She states that she smokes "occasionally" but is not a daily smoker.  She denies wheezing or shortness of breath  HPI  Past Medical History:  Diagnosis Date  . Asthma   . Bronchitis   . Seasonal allergies     Patient Active Problem List   Diagnosis Date Noted  . Asthma 01/12/2014  . COPD (chronic obstructive pulmonary disease) ([place]) 01/12/2014  . Chest pain 01/12/2014  . Family history of premature CAD 01/12/2014  . Smoking 01/12/2014    Past Surgical History:  Procedure Laterality Date  . BREAST SURGERY     removal of 2 cyst  . TUBAL LIGATION       OB History   None      Home Medications    Prior to Admission medications   Medication Sig Start Date End Date Taking? Authorizing Provider  acetaminophen (TYLENOL) 500 MG tablet Take 1,000 mg by mouth every 6 (six) hours as needed for mild pain or fever.    [provider]  aspirin 81 MG tablet Take 81 mg by mouth daily.    [provider]  erythromycin ophthalmic ointment Place a 1/2 inch ribbon of ointment into the lower eyelid for 7 days Patient not taking: Reported on 03/25/2016 11/22/14   Delos Haring, PA-C  hydrOXYzine (ATARAX/VISTARIL) 10 MG tablet Take 1 tablet (10 mg total) by mouth 3 (three) times daily as needed. Patient not taking: Reported on 03/25/2016 08/19/15   Nona Dell, PA-C  ibuprofen (ADVIL,MOTRIN) 800 MG tablet Take 1 tablet (800 mg total) by mouth 3  (three) times daily. 04/24/16   Nona Dell, PA-C  oxyCODONE-acetaminophen (PERCOCET/ROXICET) 5-325 MG tablet Take 1 tablet by mouth every 4 (four) hours as needed for severe pain. 04/24/16   Nona Dell, PA-C  Phenyleph-CPM-DM-APAP (ALKA-SELTZER PLUS COLD & COUGH) 07-09-08-325 MG CAPS Take 1-2 capsules by mouth daily as needed (cough).    [provider]  predniSONE (DELTASONE) 20 MG tablet Take 2 tablets (40 mg total) by mouth daily. Patient not taking: Reported on 03/25/2016 08/19/15   Nona Dell, PA-C    Family History Family History  Problem Relation Age of Onset  . Stroke Unknown        fx  . Heart attack Unknown        fx  . Heart disease Unknown        fx  . Hypertension Unknown        fx  . Cancer Unknown        fx    Social History Social History   Tobacco Use  . Smoking status: Current Some Day Smoker    Types: Cigarettes  . Smokeless tobacco: Never Used  Substance Use Topics  . Alcohol use: No    Alcohol/week: 0.0 oz  . Drug use: No     Allergies   Patient has no known allergies.   Review of Systems Review  of Systems Ten systems reviewed and are negative for acute change, except as noted in the HPI.    Physical Exam Updated Vital Signs BP (!) 151/101 (BP Location: Right Arm)   Pulse 98   Temp 99.3 F (37.4 C) (Oral)   Resp 20   LMP 12/18/2015   SpO2 98%   Physical Exam  Physical Exam  Nursing note and vitals reviewed. Constitutional: She is oriented to person, place, and time. She appears well-developed and well-nourished. No distress.  Patient appears ill HENT:  Head: Normocephalic and atraumatic.  Eyes: Conjunctivae normal and EOM are normal. Pupils are equal, round, and reactive to light. No scleral icterus.  Neck: Normal range of motion.  Cardiovascular: Normal rate, regular rhythm and normal heart sounds.  Exam reveals no gallop and no friction rub.   No murmur heard. Pulmonary/Chest: Effort  normal and breath sounds normal. No respiratory distress.   Abdominal: Soft. Bowel sounds are normal. She exhibits no distension and no mass. There is no tenderness. There is no guarding.  Neurological: She is alert and oriented to person, place, and time.  Skin: Skin is warm and dry. She is not diaphoretic.    ED Treatments / Results  Labs (all labs ordered are listed, but only abnormal results are displayed) Labs Reviewed - No data to display  EKG None  Radiology Dg Chest 2 View  Result Date: 07/05/2017 CLINICAL DATA:  Productive cough for 1 week. EXAM: CHEST - 2 VIEW COMPARISON:  03/13/2014 FINDINGS: Heart size is normal. Bilateral hilar soft tissue prominence remains stable, consistent with mild hilar lymphadenopathy. New mild infiltrate is seen in the left retrocardiac lung base, suspicious for pneumonia. Right lung is clear. No evidence of pleural effusion. IMPRESSION: Mild infiltrate in left lung base, suspicious for pneumonia. Stable bilateral hilar soft tissue prominence, consistent with mild hilar lymphadenopathy. Electronically Signed   By: Earle Gell M.D.   On: 07/05/2017 08:59    Procedures Procedures (including critical care time)  Medications Ordered in ED Medications - No data to display   Initial Impression / Assessment and Plan / ED Course  I have reviewed the triage vital signs and the nursing notes.  Pertinent labs & imaging results that were available during my care of the patient were reviewed by me and considered in my medical decision making (see chart for details).     Patient has been diagnosed with CAP via chest xray. Pt is ill appearing, but does not appear toxic. She is not immunocompromised, and does not have multiple co morbidities, therefore I feel like the they can be treated as an OP with abx therapy. Pt has been advised to return to the ED if symptoms worsen or they do not improve. Pt verbalizes understanding and is agreeable with plan.     Final Clinical Impressions(s) / ED Diagnoses   Final diagnoses:  Community acquired pneumonia of left lower lobe of lung [facility])    ED Discharge Orders    None       Margarita Mail, PA-C 07/05/17 1207    Duffy Bruce, MD 07/06/17 1330

## 2017-07-05 NOTE — Discharge Instructions (Signed)
Contact a health care provider if: °You have a fever. °You are losing sleep because you cannot control your cough with cough medicine. °Get help right away if: °You have worsening shortness of breath. °You have increased chest pain. °Your sickness becomes worse, especially if you are an older adult or have a weakened immune system. °You cough up blood. °

## 2017-10-02 ENCOUNTER — Other Ambulatory Visit: Payer: Self-pay

## 2017-10-02 ENCOUNTER — Emergency Department (HOSPITAL_COMMUNITY): Payer: Medicaid Other

## 2017-10-02 ENCOUNTER — Emergency Department (HOSPITAL_COMMUNITY)
Admission: EM | Admit: 2017-10-02 | Discharge: 2017-10-02 | Disposition: A | Discharge status: Home / Self Care | Payer: Medicaid Other | Attending: Emergency Medicine | Admitting: Emergency Medicine

## 2017-10-02 ENCOUNTER — Encounter (HOSPITAL_COMMUNITY): Payer: Self-pay | Admitting: Emergency Medicine

## 2017-10-02 DIAGNOSIS — R531 Weakness: Secondary | ICD-10-CM

## 2017-10-02 DIAGNOSIS — F17210 Nicotine dependence, cigarettes, uncomplicated: Secondary | ICD-10-CM | POA: Insufficient documentation

## 2017-10-02 DIAGNOSIS — J449 Chronic obstructive pulmonary disease, unspecified: Secondary | ICD-10-CM | POA: Insufficient documentation

## 2017-10-02 DIAGNOSIS — Z7982 Long term (current) use of aspirin: Secondary | ICD-10-CM | POA: Insufficient documentation

## 2017-10-02 DIAGNOSIS — J302 Other seasonal allergic rhinitis: Secondary | ICD-10-CM | POA: Insufficient documentation

## 2017-10-02 DIAGNOSIS — Z79899 Other long term (current) drug therapy: Secondary | ICD-10-CM | POA: Insufficient documentation

## 2017-10-02 DIAGNOSIS — R197 Diarrhea, unspecified: Secondary | ICD-10-CM | POA: Insufficient documentation

## 2017-10-02 LAB — URINALYSIS, ROUTINE W REFLEX MICROSCOPIC
BILIRUBIN URINE: NEGATIVE
GLUCOSE, UA: NEGATIVE mg/dL
Hgb urine dipstick: NEGATIVE
Ketones, ur: NEGATIVE mg/dL
Leukocytes, UA: NEGATIVE
Nitrite: NEGATIVE
Protein, ur: NEGATIVE mg/dL
Specific Gravity, Urine: 1.017 (ref 1.005–1.030)
pH: 6 (ref 5.0–8.0)

## 2017-10-02 LAB — CBC WITH DIFFERENTIAL/PLATELET
ABS IMMATURE GRANULOCYTES: 0 10*3/uL (ref 0.0–0.1)
Basophils Absolute: 0 10*3/uL (ref 0.0–0.1)
Basophils Relative: 1 %
Eosinophils Absolute: 0.1 10*3/uL (ref 0.0–0.7)
Eosinophils Relative: 2 %
HCT: 43.2 % (ref 36.0–46.0)
Hemoglobin: 14.1 g/dL (ref 12.0–15.0)
Immature Granulocytes: 0 %
Lymphocytes Relative: 39 %
Lymphs Abs: 1.9 10*3/uL (ref 0.7–4.0)
MCH: 30.9 pg (ref 26.0–34.0)
MCHC: 32.6 g/dL (ref 30.0–36.0)
MCV: 94.7 fL (ref 78.0–100.0)
MONOS PCT: 10 %
Monocytes Absolute: 0.5 10*3/uL (ref 0.1–1.0)
NEUTROS ABS: 2.3 10*3/uL (ref 1.7–7.7)
Neutrophils Relative %: 48 %
Platelets: 269 10*3/uL (ref 150–400)
RBC: 4.56 MIL/uL (ref 3.87–5.11)
RDW: 13.3 % (ref 11.5–15.5)
WBC: 4.9 10*3/uL (ref 4.0–10.5)

## 2017-10-02 LAB — COMPREHENSIVE METABOLIC PANEL
ALT: 18 U/L (ref 0–44)
AST: 19 U/L (ref 15–41)
Albumin: 3.9 g/dL (ref 3.5–5.0)
Alkaline Phosphatase: 90 U/L (ref 38–126)
Anion gap: 7 (ref 5–15)
BUN: 7 mg/dL (ref 6–20)
CHLORIDE: 108 mmol/L (ref 98–111)
CO2: 27 mmol/L (ref 22–32)
CREATININE: 0.78 mg/dL (ref 0.44–1.00)
Calcium: 9.4 mg/dL (ref 8.9–10.3)
GFR calc Af Amer: >60 mL/min (ref >60)
GFR calc non Af Amer: >60 mL/min (ref >60)
Glucose, Bld: 106 mg/dL — ABNORMAL HIGH (ref 70–99)
Potassium: 3.7 mmol/L (ref 3.5–5.1)
Sodium: 142 mmol/L (ref 135–145)
Total Bilirubin: 0.7 mg/dL (ref 0.3–1.2)
Total Protein: 7.5 g/dL (ref 6.5–8.1)

## 2017-10-02 MED ORDER — SODIUM CHLORIDE 0.9 % IV BOLUS
1000.0000 mL | Freq: Once | INTRAVENOUS | Status: AC
  Administered 2017-10-02: 1000 mL via INTRAVENOUS

## 2017-10-02 NOTE — ED Notes (Signed)
Discharge instructions discussed with Pt. Pt verbalized understanding. Pt stable and ambulatory.    

## 2017-10-02 NOTE — Discharge Instructions (Signed)
Return to ED for worsening symptoms, chest pain, shortness of breath, headache, lightheadedness or loss of consciousness.

## 2017-10-02 NOTE — ED Notes (Signed)
Pt ambulated to bathroom. Pt stated she is feeling better than when she first arrived and that she was currently not dizzy. Pt walked with no assistance.

## 2017-10-02 NOTE — ED Triage Notes (Signed)
Pt reports chronic constipation and that she took a laxative and has been having frequent soft stools since and is now c/o dizziness.

## 2017-10-02 NOTE — ED Provider Notes (Signed)
[place] [facility] EMERGENCY DEPARTMENT Provider Note   CSN: [id number] Arrival date & time: 10/02/17  1613     History   Chief Complaint Chief Complaint  Patient presents with  . Dizziness    HPI Taylor Cook is a 52 y.o. female with a past medical history of asthma, who presents to ED for evaluation of diarrhea, generalized weakness and lightheadedness that began today.  States that she was constipated 4 days ago which is not unusual for her.  She took an over-the-counter liquid laxative and began having several watery stools since then.  She was at work when she began feeling lightheaded.  She also felt generally weak.  She reports nausea with no vomiting.  Reports generalized abdominal soreness worse with bowel movements.  Denies any chest pain, shortness of breath, blood in her stool, hematemesis, fever, urinary symptoms, headache, vision changes or blood thinner use.  HPI  Past Medical History:  Diagnosis Date  . Asthma   . Bronchitis   . Seasonal allergies     Patient Active Problem List   Diagnosis Date Noted  . Asthma 01/12/2014  . COPD (chronic obstructive pulmonary disease) ([place]) 01/12/2014  . Chest pain 01/12/2014  . Family history of premature CAD 01/12/2014  . Smoking 01/12/2014    Past Surgical History:  Procedure Laterality Date  . BREAST SURGERY     removal of 2 cyst  . TUBAL LIGATION       OB History   None      Home Medications    Prior to Admission medications   Medication Sig Start Date End Date Taking? Authorizing Provider  acetaminophen (TYLENOL) 500 MG tablet Take 1,000 mg by mouth every 6 (six) hours as needed for mild pain or fever.    [provider]  aspirin 81 MG tablet Take 81 mg by mouth daily.    [provider]  doxycycline (VIBRAMYCIN) 100 MG capsule Take 1 capsule (100 mg total) by mouth 2 (two) times daily. One po bid x 7 days 07/05/17   Margarita Mail, PA-C  erythromycin ophthalmic ointment  Place a 1/2 inch ribbon of ointment into the lower eyelid for 7 days Patient not taking: Reported on 03/25/2016 11/22/14   Delos Haring, PA-C  guaiFENesin-codeine 100-10 MG/5ML syrup Take 5-10 mLs by mouth every 6 (six) hours as needed for cough. 07/05/17   Margarita Mail, PA-C  hydrOXYzine (ATARAX/VISTARIL) 10 MG tablet Take 1 tablet (10 mg total) by mouth 3 (three) times daily as needed. Patient not taking: Reported on 03/25/2016 08/19/15   Nona Dell, PA-C  ibuprofen (ADVIL,MOTRIN) 800 MG tablet Take 1 tablet (800 mg total) by mouth 3 (three) times daily. 04/24/16   Nona Dell, PA-C  oxyCODONE-acetaminophen (PERCOCET/ROXICET) 5-325 MG tablet Take 1 tablet by mouth every 4 (four) hours as needed for severe pain. 04/24/16   Nona Dell, PA-C  Phenyleph-CPM-DM-APAP (ALKA-SELTZER PLUS COLD & COUGH) 07-09-08-325 MG CAPS Take 1-2 capsules by mouth daily as needed (cough).    [provider]  predniSONE (DELTASONE) 20 MG tablet Take 2 tablets (40 mg total) by mouth daily. Patient not taking: Reported on 03/25/2016 08/19/15   Nona Dell, PA-C    Family History Family History  Problem Relation Age of Onset  . Stroke Unknown        fx  . Heart attack Unknown        fx  . Heart disease Unknown        fx  .  Hypertension Unknown        fx  . Cancer Unknown        fx    Social History Social History   Tobacco Use  . Smoking status: Current Some Day Smoker    Types: Cigarettes  . Smokeless tobacco: Never Used  Substance Use Topics  . Alcohol use: No    Alcohol/week: 0.0 oz  . Drug use: No     Allergies   Patient has no known allergies.   Review of Systems Review of Systems  Constitutional: Positive for fatigue. Negative for appetite change, chills and fever.  HENT: Negative for ear pain, rhinorrhea, sneezing and sore throat.   Eyes: Negative for photophobia and visual disturbance.  Respiratory: Negative for cough, chest  tightness, shortness of breath and wheezing.   Cardiovascular: Negative for chest pain and palpitations.  Gastrointestinal: Positive for diarrhea and nausea. Negative for abdominal pain, blood in stool, constipation and vomiting.  Genitourinary: Negative for dysuria, hematuria and urgency.  Musculoskeletal: Negative for myalgias.  Skin: Negative for rash.  Neurological: Positive for dizziness. Negative for weakness and light-headedness.     Physical Exam Updated Vital Signs BP (!) 166/90   Pulse (!) 57   Temp 98.3 F (36.8 C) (Oral)   Resp 17   Wt 81.6 kg (180 lb)   LMP 12/18/2015   SpO2 97%   BMI 32.92 kg/m   Physical Exam  Constitutional: She appears well-developed and well-nourished. No distress.  HENT:  Head: Normocephalic and atraumatic.  Nose: Nose normal.  Eyes: Conjunctivae and EOM are normal. Right eye exhibits no discharge. Left eye exhibits no discharge. No scleral icterus.  Neck: Normal range of motion. Neck supple.  Cardiovascular: Normal rate, regular rhythm, normal heart sounds and intact distal pulses. Exam reveals no gallop and no friction rub.  No murmur heard. Pulmonary/Chest: Effort normal and breath sounds normal. No respiratory distress.  Abdominal: Soft. Bowel sounds are normal. She exhibits no distension. There is no tenderness. There is no guarding.  No abdominal tenderness to palpation.  Musculoskeletal: Normal range of motion. She exhibits no edema.  Neurological: She is alert. She exhibits normal muscle tone. Coordination normal.  Skin: Skin is warm and dry. No rash noted.  Psychiatric: She has a normal mood and affect.  Nursing note and vitals reviewed.    ED Treatments / Results  Labs (all labs ordered are listed, but only abnormal results are displayed) Labs Reviewed  COMPREHENSIVE METABOLIC PANEL - Abnormal; Notable for the following components:      Result Value   Glucose, Bld 106 (*)    All other components within normal limits    URINALYSIS, ROUTINE W REFLEX MICROSCOPIC - Abnormal; Notable for the following components:   APPearance HAZY (*)    All other components within normal limits  CBC WITH DIFFERENTIAL/PLATELET    EKG None  Radiology Dg Abdomen 1 View  Result Date: 10/02/2017 CLINICAL DATA:  Chronic constipation. Patient took a laxative and has been having frequent soft stools. Now dizziness. EXAM: ABDOMEN - 1 VIEW COMPARISON:  CT abdomen and pelvis 03/25/2016 FINDINGS: Scattered gas and stool throughout the colon. Gas within a few small bowel loops. No small or large bowel dilatation. No radiopaque stones. Visualized bones appear intact. Calcified phleboliths in the pelvis. IMPRESSION: Nonobstructive bowel gas pattern. Electronically Signed   By: Lucienne Capers M.D.   On: 10/02/2017 21:35    Procedures Procedures (including critical care time)  Medications Ordered in ED Medications  sodium chloride 0.9 % bolus 1,000 mL (1,000 mLs Intravenous New Bag/Given 10/02/17 2046)     Initial Impression / Assessment and Plan / ED Course  I have reviewed the triage vital signs and the nursing notes.  Pertinent labs & imaging results that were available during my care of the patient were reviewed by me and considered in my medical decision making (see chart for details).     52 year old female with past medical history of asthma presents to ED for evaluation of diarrhea, generalized weakness and lightheadedness that began today.  Patient states that she was constipated 4 days ago which is unusual for her to.  This prompted her to take a laxative after which she began having several watery stools a day.  Denies any blood in her stool.  Reports nausea with no vomiting.  States that she felt generally weak while at work.  On physical exam she is overall well-appearing.  No abdominal tenderness to palpation.  She denies any headache, vision changes, fever, chest pain, shortness of breath.  Vital signs within normal  limits.  She is not tachycardic, tachypneic or hypoxic.  She does not have any abdominal tenderness to palpation.  CBC, CMP, urinalysis all unremarkable.  Abdominal x-ray shows nonobstructive bowel gas pattern.  Patient was given fluids here with significant improvement in her symptoms.  Able to ambulate without dizziness or any difficulty.  Suspect that her symptoms could be due to mild dehydration from recurrent diarrhea.  Will advise her to no longer take laxatives to help improve her diarrhea.  Advised her to increase p.o. intake and to slowly advance her diet.  Doubt cardiac, neurological cause of her dizziness or surgical cause of her GI symptoms.  Advised to follow-up with PCP and to return to ED for any severe worsening symptoms.  Portions of this note were generated with [employer]. Dictation errors may occur despite best attempts at proofreading.   Final Clinical Impressions(s) / ED Diagnoses   Final diagnoses:  Diarrhea, unspecified type  Generalized weakness    ED Discharge Orders    None       Delia Heady, PA-C 10/02/17 2243    Duffy Bruce, MD 10/05/17 [phone]

## 2017-10-02 NOTE — ED Provider Notes (Signed)
Patient placed in Quick Look pathway, seen and evaluated   Chief Complaint: Diarrhea, lightheadedness, generalized weakness  HPI: Patient reports being constipated 4 days ago and getting a laxative.  This helped within a few hours but she has had continuous watery stools since that time.  No blood in the stools.  Today she went to work and felt very weak and lightheaded.  She had numbness in her left arm.  The weakness was generalized.  She did not have any focal weakness.  She went home and went to sleep.  After waking up she decided to come to the emergency department.  No chest pain or shortness of breath.  She reports generalized abdominal soreness.  No urinary symptoms.  ROS:  Positive ROS: (+) Diarrhea, weakness Negative ROS: (-) Blood in the stool, vomiting  Physical Exam:   Gen: No distress  Neuro: Awake and Alert  Skin: Warm    Focused Exam: Heart RRR, nml S1,S2, no m/r/g; Lungs CTAB; Abd soft, NT, no rebound or guarding; Ext 2+ pedal pulses bilaterally, no edema.  BP (!) 179/103 (BP Location: Right Arm)   Pulse 68   Temp 98.3 F (36.8 C) (Oral)   Resp 14   Wt 81.6 kg (180 lb)   LMP 12/18/2015   SpO2 100%   BMI 32.92 kg/m   Plan: Lab work-up initiated.  Initiation of care has begun. The patient has been counseled on the process, plan, and necessity for staying for the completion/evaluation, and the remainder of the medical screening examination    Carlisle Cater, Hershal Coria 10/02/17 1643    Lajean Saver, MD 10/02/17 1759

## 2017-12-11 ENCOUNTER — Encounter (HOSPITAL_COMMUNITY): Payer: Self-pay | Admitting: Emergency Medicine

## 2017-12-11 ENCOUNTER — Emergency Department (HOSPITAL_COMMUNITY)
Admission: EM | Admit: 2017-12-11 | Discharge: 2017-12-11 | Disposition: A | Discharge status: Home / Self Care | Payer: Self-pay | Attending: Emergency Medicine | Admitting: Emergency Medicine

## 2017-12-11 ENCOUNTER — Other Ambulatory Visit: Payer: Self-pay

## 2017-12-11 ENCOUNTER — Emergency Department (HOSPITAL_COMMUNITY): Payer: Self-pay

## 2017-12-11 DIAGNOSIS — I10 Essential (primary) hypertension: Secondary | ICD-10-CM | POA: Insufficient documentation

## 2017-12-11 DIAGNOSIS — Z79899 Other long term (current) drug therapy: Secondary | ICD-10-CM | POA: Insufficient documentation

## 2017-12-11 DIAGNOSIS — Z7982 Long term (current) use of aspirin: Secondary | ICD-10-CM | POA: Insufficient documentation

## 2017-12-11 DIAGNOSIS — J449 Chronic obstructive pulmonary disease, unspecified: Secondary | ICD-10-CM | POA: Insufficient documentation

## 2017-12-11 DIAGNOSIS — Z87891 Personal history of nicotine dependence: Secondary | ICD-10-CM | POA: Insufficient documentation

## 2017-12-11 HISTORY — DX: Essential (primary) hypertension: I10

## 2017-12-11 LAB — BASIC METABOLIC PANEL
Anion gap: 9 (ref 5–15)
BUN: 13 mg/dL (ref 6–20)
CHLORIDE: 106 mmol/L (ref 98–111)
CO2: 26 mmol/L (ref 22–32)
Calcium: 9.4 mg/dL (ref 8.9–10.3)
Creatinine, Ser: 0.8 mg/dL (ref 0.44–1.00)
GFR calc Af Amer: >60 mL/min (ref >60)
GLUCOSE: 79 mg/dL (ref 70–99)
POTASSIUM: 3.8 mmol/L (ref 3.5–5.1)
Sodium: 141 mmol/L (ref 135–145)

## 2017-12-11 LAB — CBC
HCT: 41.8 % (ref 36.0–46.0)
Hemoglobin: 14 g/dL (ref 12.0–15.0)
MCH: 31.5 pg (ref 26.0–34.0)
MCHC: 33.5 g/dL (ref 30.0–36.0)
MCV: 94.1 fL (ref 78.0–100.0)
Platelets: 248 10*3/uL (ref 150–400)
RBC: 4.44 MIL/uL (ref 3.87–5.11)
RDW: 13 % (ref 11.5–15.5)
WBC: 4.5 10*3/uL (ref 4.0–10.5)

## 2017-12-11 LAB — TSH: TSH: 0.448 u[IU]/mL (ref 0.350–4.500)

## 2017-12-11 MED ORDER — HYDROCHLOROTHIAZIDE 25 MG PO TABS: 25.0000 mg | ORAL_TABLET | Freq: Every day | ORAL | 1 refills | Status: DC

## 2017-12-11 NOTE — ED Provider Notes (Signed)
[facility] EMERGENCY DEPARTMENT Provider Note   CSN: [id number] Arrival date & time: 12/11/17  1548     History   Chief Complaint Chief Complaint  Patient presents with  . Lump on Chest    HPI Taylor Cook is a 52 y.o. female.  HPI  52 year old female presents with a painful spot on her clavicle.  This is been there for months.  Seems to wax and wane in size.  She does not have any trouble breathing or swallowing.  She has not sought any previous medical care for this.  Currently it is not as swollen as it usually has been.  She is tried Tylenol without relief.  Former smoker.  She is noted to be hypertensive and states she usually is hypertensive when coming to the ER, is not on any medicine.  Past Medical History:  Diagnosis Date  . Asthma   . Bronchitis   . Hypertension   . Seasonal allergies     Patient Active Problem List   Diagnosis Date Noted  . Asthma 01/12/2014  . COPD (chronic obstructive pulmonary disease) ([place]) 01/12/2014  . Chest pain 01/12/2014  . Family history of premature CAD 01/12/2014  . Smoking 01/12/2014    Past Surgical History:  Procedure Laterality Date  . BREAST SURGERY     removal of 2 cyst  . TUBAL LIGATION       OB History   None      Home Medications    Prior to Admission medications   Medication Sig Start Date End Date Taking? Authorizing Provider  acetaminophen (TYLENOL) 500 MG tablet Take 1,000 mg by mouth every 6 (six) hours as needed for mild pain or fever.    [provider]  aspirin 81 MG tablet Take 81 mg by mouth daily.    [provider]  doxycycline (VIBRAMYCIN) 100 MG capsule Take 1 capsule (100 mg total) by mouth 2 (two) times daily. One po bid x 7 days 07/05/17   Margarita Mail, PA-C  erythromycin ophthalmic ointment Place a 1/2 inch ribbon of ointment into the lower eyelid for 7 days Patient not taking: Reported on 03/25/2016 11/22/14   Delos Haring, PA-C  guaiFENesin-codeine  100-10 MG/5ML syrup Take 5-10 mLs by mouth every 6 (six) hours as needed for cough. 07/05/17   Margarita Mail, PA-C  hydrochlorothiazide (HYDRODIURIL) 25 MG tablet Take 1 tablet (25 mg total) by mouth daily. 12/11/17   Sherwood Gambler, MD  hydrOXYzine (ATARAX/VISTARIL) 10 MG tablet Take 1 tablet (10 mg total) by mouth 3 (three) times daily as needed. Patient not taking: Reported on 03/25/2016 08/19/15   Nona Dell, PA-C  ibuprofen (ADVIL,MOTRIN) 800 MG tablet Take 1 tablet (800 mg total) by mouth 3 (three) times daily. 04/24/16   Nona Dell, PA-C  oxyCODONE-acetaminophen (PERCOCET/ROXICET) 5-325 MG tablet Take 1 tablet by mouth every 4 (four) hours as needed for severe pain. 04/24/16   Nona Dell, PA-C  Phenyleph-CPM-DM-APAP (ALKA-SELTZER PLUS COLD & COUGH) 07-09-08-325 MG CAPS Take 1-2 capsules by mouth daily as needed (cough).    [provider]  predniSONE (DELTASONE) 20 MG tablet Take 2 tablets (40 mg total) by mouth daily. Patient not taking: Reported on 03/25/2016 08/19/15   Nona Dell, PA-C    Family History Family History  Problem Relation Age of Onset  . Stroke Unknown        fx  . Heart attack Unknown        fx  .  Heart disease Unknown        fx  . Hypertension Unknown        fx  . Cancer Unknown        fx    Social History Social History   Tobacco Use  . Smoking status: Former Smoker    Types: Cigarettes    Last attempt to quit: 10/12/2017    Years since quitting: 0.1  . Smokeless tobacco: Never Used  Substance Use Topics  . Alcohol use: No    Alcohol/week: 0.0 standard drinks  . Drug use: No     Allergies   Patient has no known allergies.   Review of Systems Review of Systems  Constitutional: Negative for fever.  HENT: Negative for sore throat, trouble swallowing and voice change.   Respiratory: Negative for shortness of breath.   Cardiovascular: Positive for chest pain.     Physical Exam Updated  Vital Signs BP (!) 168/91   Pulse 70   Temp 98.7 F (37.1 C) (Oral)   Resp 17   Ht 5\' 2"  (1.575 m)   Wt 75.8 kg   LMP 12/18/2015   SpO2 98%   BMI 30.54 kg/m   Physical Exam  Constitutional: She appears well-developed and well-nourished. No distress.  HENT:  Head: Normocephalic and atraumatic.  Right Ear: External ear normal.  Left Ear: External ear normal.  Nose: Nose normal.  Eyes: Right eye exhibits no discharge. Left eye exhibits no discharge.  Neck:  I do not appreciate any obvious thyromegaly or anterior neck swelling or tenderness.  Cardiovascular: Normal rate, regular rhythm and normal heart sounds.  Pulmonary/Chest: Effort normal and breath sounds normal. She exhibits tenderness (mild).    Abdominal: Soft. There is no tenderness.  Neurological: She is alert.  Skin: Skin is warm and dry. She is not diaphoretic.  Psychiatric: Her mood appears not anxious.  Nursing note and vitals reviewed.    ED Treatments / Results  Labs (all labs ordered are listed, but only abnormal results are displayed) Labs Reviewed  CBC  BASIC METABOLIC PANEL  TSH    EKG None  Radiology Dg Chest 2 View  Result Date: 12/11/2017 CLINICAL DATA:  Lump on chest, lump at RIGHT-side of neck for past month, pain and pulling at RIGHT-side of chest worse over past few weeks, trouble swallowing at times, shortness of breath EXAM: CHEST - 2 VIEW COMPARISON:  07/05/2017 FINDINGS: Upper normal heart size. Mediastinal contours and pulmonary vascularity normal. Lungs clear. No pleural effusion or pneumothorax. Osseous structures unremarkable. IMPRESSION: No acute abnormalities. Electronically Signed   By: Lavonia Dana M.D.   On: 12/11/2017 17:20    Procedures Procedures (including critical care time)  Medications Ordered in ED Medications - No data to display   Initial Impression / Assessment and Plan / ED Course  I have reviewed the triage vital signs and the nursing notes.  Pertinent labs  & imaging results that were available during my care of the patient were reviewed by me and considered in my medical decision making (see chart for details).     Labs and x-ray from triage have been reviewed and these are benign.  There is no obvious bony abnormality seen on the chest x-ray.  Unclear if she has a true area of swelling or not or if this is how she always is.  I do not see any obvious abscess.  I do not see no reason for an emergent CT or ultrasound of her neck or  chest at this time.  She is noted to be hypertensive although is not really having symptoms.  I will prescribe medicine given previous visits have also showed hypertension.  I stressed the importance of following up with the PCP but I do not think emergent imaging is needed and she stable for discharge home.  Recommended to try NSAIDs for the swelling/pain.  Final Clinical Impressions(s) / ED Diagnoses   Final diagnoses:  Essential hypertension    ED Discharge Orders         Ordered    hydrochlorothiazide (HYDRODIURIL) 25 MG tablet  Daily     12/11/17 2010           Sherwood Gambler, MD 12/11/17 2035

## 2017-12-11 NOTE — ED Provider Notes (Signed)
Patient placed in Quick Look pathway, seen and evaluated   Chief Complaint: mass  HPI:   Patient notes tender mass superior to the right clavicle. States it increases and decreases in size. States she can feel it "pulling" on her chest. No fevers. Quit smoking 2 months ago. States she has hypertension but has never been prescribed medications for this.  ROS: Tenderness to chest  Physical Exam:   Gen: No distress  Neuro: Awake and Alert  Skin: Warm    Focused Exam: There is a nodularity that is tender to palpation just superior to the right side of the sternum.  Nonmobile.  No erythema or induration.  Lungs clear to auscultation.  Heart regular rate and rhythm.   Initiation of care has begun. The patient has been counseled on the process, plan, and necessity for staying for the completion/evaluation, and the remainder of the medical screening examination    Renita Papa, PA-C 12/11/17 Pickett, Sam, MD 12/12/17 [phone]

## 2017-12-11 NOTE — ED Notes (Signed)
No changes in acuity at this time.

## 2017-12-11 NOTE — ED Notes (Signed)
Results reviewed, no change in acuity

## 2017-12-11 NOTE — ED Triage Notes (Addendum)
Pt reports a lump to the rt side of her lower neck/chest area, looks asymmetrical. Pt reports she noticed it a week ago. Painful upon position changes and palpation. Pt reports she feels it pulling on chest when she moves and exertional sob. Pt reports pain 6/10, denies injuries. Hx of HTN but not on medications.

## 2017-12-11 NOTE — Discharge Instructions (Addendum)
If you develop worsening swelling of your chest, neck, trouble breathing or swallowing, or any other new/concerning symptoms or return to the ER for evaluation.  You are being started on blood pressure medicine today, it is very important to follow-up with a primary care physician to help control this on long-term basis.

## 2017-12-30 NOTE — Progress Notes (Signed)
Patient ID: Taylor Cook, female   DOB: April 12, 1965, 52 y.o.   MRN: [id number]      Taylor Cook, is a 52 y.o. female  WUJ:[id number]  [id number]  DOB - Aug 03, 1965  Subjective:  Chief Complaint and HPI: Taylor Cook is a 52 y.o. female here today to establish care and for a follow up visit After being seen in the ED 12/11/2017.  She was started on HCTZ for htn.  She is tolerating it well.  No HA/CP/dizziness.    From HPI: 52 year old female presents with a painful spot on her clavicle.  This is been there for months.  Seems to wax and wane in size.  She does not have any trouble breathing or swallowing.  She has not sought any previous medical care for this.  Currently it is not as swollen as it usually has been.  She is tried Tylenol without relief.  Former smoker.  She is noted to be hypertensive and states she usually is hypertensive when coming to the ER, is not on any medicine.  A/P: Labs and x-ray from triage have been reviewed and these are benign.  There is no obvious bony abnormality seen on the chest x-ray.  Unclear if she has a true area of swelling or not or if this is how she always is.  I do not see any obvious abscess.  I do not see no reason for an emergent CT or ultrasound of her neck or chest at this time.  She is noted to be hypertensive although is not really having symptoms.  I will prescribe medicine given previous visits have also showed hypertension.  I stressed the importance of following up with the PCP but I do not think emergent imaging is needed and she stable for discharge home.  Recommended to try NSAIDs for the swelling/pain.  ED/Hospital notes reviewed and summarized above  ROS:   Constitutional:  No f/c, No night sweats, No unexplained weight loss. EENT:  No vision changes, No blurry vision, No hearing changes. No mouth, throat, or ear problems.  Respiratory: No cough, No SOB Cardiac: No CP, no palpitations GI:  No abd pain, No N/V/D. GU: No Urinary  s/sx Musculoskeletal: No joint pain Neuro: No headache, no dizziness, no motor weakness.  Skin: No rash Endocrine:  No polydipsia. No polyuria.  Psych: Denies SI/HI  No problems updated.  ALLERGIES: No Known Allergies  PAST MEDICAL HISTORY: Past Medical History:  Diagnosis Date  . Asthma   . Bronchitis   . Hypertension   . Seasonal allergies     MEDICATIONS AT HOME: Prior to Admission medications   Medication Sig Start Date End Date Taking? Authorizing Provider  acetaminophen (TYLENOL) 500 MG tablet Take 1,000 mg by mouth every 6 (six) hours as needed for mild pain or fever.    [provider]  aspirin 81 MG tablet Take 81 mg by mouth daily.    [provider]  hydrochlorothiazide (HYDRODIURIL) 25 MG tablet Take 1 tablet (25 mg total) by mouth daily. 12/31/17   Argentina Donovan, PA-C  ibuprofen (ADVIL,MOTRIN) 800 MG tablet Take 1 tablet (800 mg total) by mouth 3 (three) times daily. 04/24/16   Nona Dell, PA-C  oxyCODONE-acetaminophen (PERCOCET/ROXICET) 5-325 MG tablet Take 1 tablet by mouth every 4 (four) hours as needed for severe pain. 04/24/16   Nona Dell, PA-C  Phenyleph-CPM-DM-APAP (ALKA-SELTZER PLUS COLD & COUGH) 07-09-08-325 MG CAPS Take 1-2 capsules by mouth daily as needed (cough).  [provider]     Objective:  EXAM:   Vitals:   12/31/17 1627  BP: 132/82  Pulse: 87  Resp: 18  Temp: 98.2 F (36.8 C)  TempSrc: Oral  SpO2: 99%  Weight: 157 lb (71.2 kg)  Height: 5\' 2"  (1.575 m)    General appearance : A&OX3. NAD. Non-toxic-appearing HEENT: Atraumatic and Normocephalic.  PERRLA. EOM intact.  Neck: supple, no JVD. No cervical lymphadenopathy. No thyromegaly Chest/Lungs:  Breathing-non-labored, Good air entry bilaterally, breath sounds normal without rales, rhonchi, or wheezing  CVS: S1 S2 regular, no murmurs, gallops, rubs  Extremities: Bilateral Lower Ext shows no edema, both legs are warm to touch  with = pulse throughout Neurology:  CN II-XII grossly intact, Non focal.   Psych:  TP linear. J/I WNL. Normal speech. Appropriate eye contact and affect.  Skin:  No Rash  Data Review No results found for: HGBA1C   Assessment & Plan   1. Hypertension, unspecified type WNL.  Continue current regimen - Basic metabolic panel - hydrochlorothiazide (HYDRODIURIL) 25 MG tablet; Take 1 tablet (25 mg total) by mouth daily.  Dispense: 90 tablet; Refill: 1  2. Encounter for examination following treatment at hospital Doing well     Patient have been counseled extensively about nutrition and exercise  Return in about 2 months (around 03/02/2018) for assign PCP; f/up BP.  The patient was given clear instructions to go to ER or return to medical center if symptoms don't improve, worsen or new problems develop. The patient verbalized understanding. The patient was told to call to get lab results if they haven't heard anything in the next week.     Freeman Caldron, PA-C [facility] and [facility], [place]   12/31/2017, 4:29 PM

## 2017-12-31 ENCOUNTER — Ambulatory Visit: Payer: Self-pay | Attending: Family Medicine | Admitting: Physician Assistant

## 2017-12-31 VITALS — BP 132/82 | HR 87 | Temp 98.2°F | Resp 18 | Ht 62.0 in | Wt 157.0 lb

## 2017-12-31 DIAGNOSIS — I10 Essential (primary) hypertension: Secondary | ICD-10-CM | POA: Insufficient documentation

## 2017-12-31 DIAGNOSIS — Z87891 Personal history of nicotine dependence: Secondary | ICD-10-CM | POA: Insufficient documentation

## 2017-12-31 DIAGNOSIS — Z79891 Long term (current) use of opiate analgesic: Secondary | ICD-10-CM | POA: Insufficient documentation

## 2017-12-31 DIAGNOSIS — Z09 Encounter for follow-up examination after completed treatment for conditions other than malignant neoplasm: Secondary | ICD-10-CM

## 2017-12-31 DIAGNOSIS — Z7982 Long term (current) use of aspirin: Secondary | ICD-10-CM | POA: Insufficient documentation

## 2017-12-31 DIAGNOSIS — Z5189 Encounter for other specified aftercare: Secondary | ICD-10-CM | POA: Insufficient documentation

## 2017-12-31 DIAGNOSIS — Z79899 Other long term (current) drug therapy: Secondary | ICD-10-CM | POA: Insufficient documentation

## 2017-12-31 MED ORDER — HYDROCHLOROTHIAZIDE 25 MG PO TABS: 25.0000 mg | ORAL_TABLET | Freq: Every day | ORAL | 1 refills | Status: DC

## 2018-01-01 LAB — BASIC METABOLIC PANEL
BUN / CREAT RATIO: 25 — AB (ref 9–23)
BUN: 22 mg/dL (ref 6–24)
CO2: 24 mmol/L (ref 20–29)
CREATININE: 0.89 mg/dL (ref 0.57–1.00)
Calcium: 9.4 mg/dL (ref 8.7–10.2)
Chloride: 100 mmol/L (ref 96–106)
GFR, EST AFRICAN AMERICAN: 87 mL/min/{1.73_m2} (ref >59)
GFR, EST NON AFRICAN AMERICAN: 75 mL/min/{1.73_m2} (ref >59)
GLUCOSE: 81 mg/dL (ref 65–99)
Potassium: 3.4 mmol/L — ABNORMAL LOW (ref 3.5–5.2)
SODIUM: 141 mmol/L (ref 134–144)

## 2018-01-05 ENCOUNTER — Telehealth: Payer: Self-pay | Admitting: *Deleted

## 2018-01-05 NOTE — Telephone Encounter (Signed)
Patient verified DOB Patient is aware of potassium being low possibly due to starting HCTZ. Patient advised on potassium rich foods that she could include more of in her diet. Patient aware of needing to continue with water intake and a recheck will be completed in December. Patient would also like to discuss google findings she found regarding HCTZ with PCP at 02/23/18 appointment. No further questions.

## 2018-01-05 NOTE — Telephone Encounter (Signed)
-----   Message from [place] Donovan, [place] sent at 01/01/2018  7:36 AM EDT ----- Please call patient.  Her potassium is a little low probably due to the new medication she is on.  Tell her to include potassium rich foods in her diet(1/2 banana, 1/2 glass orange juice, lots of green veggies)in her diet.  She can look up other sources of potassium.  We will recheck this at her follow-up.  Also, tell her to drink lots of water.  Follow up as planned.  Thanks, Freeman Caldron, PA-C

## 2018-02-23 ENCOUNTER — Encounter: Payer: Self-pay | Admitting: Family Medicine

## 2018-02-23 ENCOUNTER — Ambulatory Visit: Payer: Self-pay | Attending: Family Medicine | Admitting: Family Medicine

## 2018-02-23 VITALS — BP 122/77 | HR 63 | Temp 98.2°F | Resp 18 | Ht 62.0 in | Wt 157.0 lb

## 2018-02-23 DIAGNOSIS — I10 Essential (primary) hypertension: Secondary | ICD-10-CM | POA: Insufficient documentation

## 2018-02-23 DIAGNOSIS — J4520 Mild intermittent asthma, uncomplicated: Secondary | ICD-10-CM

## 2018-02-23 DIAGNOSIS — Z87891 Personal history of nicotine dependence: Secondary | ICD-10-CM | POA: Insufficient documentation

## 2018-02-23 DIAGNOSIS — J309 Allergic rhinitis, unspecified: Secondary | ICD-10-CM

## 2018-02-23 DIAGNOSIS — Z8249 Family history of ischemic heart disease and other diseases of the circulatory system: Secondary | ICD-10-CM | POA: Insufficient documentation

## 2018-02-23 DIAGNOSIS — Z1239 Encounter for other screening for malignant neoplasm of breast: Secondary | ICD-10-CM

## 2018-02-23 MED ORDER — CETIRIZINE HCL 10 MG PO TABS: 10.0000 mg | ORAL_TABLET | Freq: Every day | ORAL | 11 refills | Status: DC

## 2018-02-23 MED ORDER — ALBUTEROL SULFATE HFA 108 (90 BASE) MCG/ACT IN AERS: 2.0000 | INHALATION_SPRAY | Freq: Four times a day (QID) | RESPIRATORY_TRACT | 2 refills | Status: DC | PRN

## 2018-02-23 NOTE — Patient Instructions (Signed)
Asthma, Adult Asthma is a condition of the lungs in which the airways tighten and narrow. Asthma can make it hard to breathe. Asthma cannot be cured, but medicine and lifestyle changes can help control it. Asthma may be started (triggered) by:  Animal skin flakes (dander).  Dust.  Cockroaches.  Pollen.  Mold.  Smoke.  Cleaning products.  Hair sprays or aerosol sprays.  Paint fumes or strong smells.  Cold air, weather changes, and winds.  Crying or laughing hard.  Stress.  Certain medicines or drugs.  Foods, such as dried fruit, potato chips, and sparkling grape juice.  Infections or conditions (colds, flu).  Exercise.  Certain medical conditions or diseases.  Exercise or tiring activities.  Follow these instructions at home:  Take medicine as told by your doctor.  Use a peak flow meter as told by your doctor. A peak flow meter is a tool that measures how well the lungs are working.  Record and keep track of the peak flow meter's readings.  Understand and use the asthma action plan. An asthma action plan is a written plan for taking care of your asthma and treating your attacks.  To help prevent asthma attacks: ? Do not smoke. Stay away from secondhand smoke. ? Change your heating and air conditioning filter often. ? Limit your use of fireplaces and wood stoves. ? Get rid of pests (such as roaches and mice) and their droppings. ? Throw away plants if you see mold on them. ? Clean your floors. Dust regularly. Use cleaning products that do not smell. ? Have someone vacuum when you are not home. Use a vacuum cleaner with a HEPA filter if possible. ? Replace carpet with wood, tile, or vinyl flooring. Carpet can trap animal skin flakes and dust. ? Use allergy-proof pillows, mattress covers, and box spring covers. ? Wash bed sheets and blankets every week in hot water and dry them in a dryer. ? Use blankets that are made of polyester or cotton. ? Clean bathrooms  and kitchens with bleach. If possible, have someone repaint the walls in these rooms with mold-resistant paint. Keep out of the rooms that are being cleaned and painted. ? Wash hands often. Contact a doctor if:  You have make a whistling sound when breaking (wheeze), have shortness of breath, or have a cough even if taking medicine to prevent attacks.  The colored mucus you cough up (sputum) is thicker than usual.  The colored mucus you cough up changes from clear or white to yellow, green, gray, or bloody.  You have problems from the medicine you are taking such as: ? A rash. ? Itching. ? Swelling. ? Trouble breathing.  You need reliever medicines more than 2-3 times a week.  Your peak flow measurement is still at 50-79% of your personal best after following the action plan for 1 hour.  You have a fever. Get help right away if:  You seem to be worse and are not responding to medicine during an asthma attack.  You are short of breath even at rest.  You get short of breath when doing very little activity.  You have trouble eating, drinking, or talking.  You have chest pain.  You have a fast heartbeat.  Your lips or fingernails start to turn blue.  You are light-headed, dizzy, or faint.  Your peak flow is less than 50% of your personal best. This information is not intended to replace advice given to you by your health care provider. Make   sure you discuss any questions you have with your health care provider. Document Released: 08/13/2007 Document Revised: 08/02/2015 Document Reviewed: 09/23/2012 Elsevier Interactive Patient Education  2017 [place] DASH stands for "Dietary Approaches to Stop Hypertension." The DASH eating plan is a healthy eating plan that has been shown to reduce high blood pressure (hypertension). It may also reduce your risk for type 2 diabetes, heart disease, and stroke. The DASH eating plan may also help with weight loss. What  are tips for following this plan? General guidelines  Avoid eating more than 2,300 mg (milligrams) of salt (sodium) a day. If you have hypertension, you may need to reduce your sodium intake to 1,500 mg a day.  Limit alcohol intake to no more than 1 drink a day for nonpregnant women and 2 drinks a day for men. One drink equals 12 oz of beer, 5 oz of wine, or 1 oz of hard liquor.  Work with your health care provider to maintain a healthy body weight or to lose weight. Ask what an ideal weight is for you.  Get at least 30 minutes of exercise that causes your heart to beat faster (aerobic exercise) most days of the week. Activities may include walking, swimming, or biking.  Work with your health care provider or diet and nutrition specialist (dietitian) to adjust your eating plan to your individual calorie needs. Reading food labels  Check food labels for the amount of sodium per serving. Choose foods with less than 5 percent of the Daily Value of sodium. Generally, foods with less than 300 mg of sodium per serving fit into this eating plan.  To find whole grains, look for the word "whole" as the first word in the ingredient list. Shopping  Buy products labeled as "low-sodium" or "no salt added."  Buy fresh foods. Avoid canned foods and premade or frozen meals. Cooking  Avoid adding salt when cooking. Use salt-free seasonings or herbs instead of table salt or sea salt. Check with your health care provider or pharmacist before using salt substitutes.  Do not fry foods. Cook foods using healthy methods such as baking, boiling, grilling, and broiling instead.  Cook with heart-healthy oils, such as olive, canola, soybean, or sunflower oil. Meal planning   Eat a balanced diet that includes: ? 5 or more servings of fruits and vegetables each day. At each meal, try to fill half of your plate with fruits and vegetables. ? Up to 6-8 servings of whole grains each day. ? Less than 6 oz of lean  meat, poultry, or fish each day. A 3-oz serving of meat is about the same size as a deck of cards. One egg equals 1 oz. ? 2 servings of low-fat dairy each day. ? A serving of nuts, seeds, or beans 5 times each week. ? Heart-healthy fats. Healthy fats called Omega-3 fatty acids are found in foods such as flaxseeds and coldwater fish, like sardines, salmon, and mackerel.  Limit how much you eat of the following: ? Canned or prepackaged foods. ? Food that is high in trans fat, such as fried foods. ? Food that is high in saturated fat, such as fatty meat. ? Sweets, desserts, sugary drinks, and other foods with added sugar. ? Full-fat dairy products.  Do not salt foods before eating.  Try to eat at least 2 vegetarian meals each week.  Eat more home-cooked food and less restaurant, buffet, and fast food.  When eating at a restaurant, ask that  your food be prepared with less salt or no salt, if possible. What foods are recommended? The items listed may not be a complete list. Talk with your dietitian about what dietary choices are best for you. Grains Whole-grain or whole-wheat bread. Whole-grain or whole-wheat pasta. Brown rice. [place]. Bulgur. Whole-grain and low-sodium cereals. Pita bread. Low-fat, low-sodium crackers. Whole-wheat flour tortillas. Vegetables Fresh or frozen vegetables (raw, steamed, roasted, or grilled). Low-sodium or reduced-sodium tomato and vegetable juice. Low-sodium or reduced-sodium tomato sauce and tomato paste. Low-sodium or reduced-sodium canned vegetables. Fruits All fresh, dried, or frozen fruit. Canned fruit in natural juice (without added sugar). Meat and other protein foods Skinless chicken or [place]. Ground chicken or [place]. Pork with fat trimmed off. Fish and seafood. Egg whites. Dried beans, peas, or lentils. Unsalted nuts, nut butters, and seeds. Unsalted canned beans. Lean cuts of beef with fat trimmed off. Low-sodium, lean deli  meat. Dairy Low-fat (1%) or fat-free (skim) milk. Fat-free, low-fat, or reduced-fat cheeses. Nonfat, low-sodium ricotta or cottage cheese. Low-fat or nonfat yogurt. Low-fat, low-sodium cheese. Fats and oils Soft margarine without trans fats. Vegetable oil. Low-fat, reduced-fat, or light mayonnaise and salad dressings (reduced-sodium). Canola, safflower, olive, soybean, and sunflower oils. Avocado. Seasoning and other foods Herbs. Spices. Seasoning mixes without salt. Unsalted popcorn and pretzels. Fat-free sweets. What foods are not recommended? The items listed may not be a complete list. Talk with your dietitian about what dietary choices are best for you. Grains Baked goods made with fat, such as croissants, muffins, or some breads. Dry pasta or rice meal packs. Vegetables Creamed or fried vegetables. Vegetables in a cheese sauce. Regular canned vegetables (not low-sodium or reduced-sodium). Regular canned tomato sauce and paste (not low-sodium or reduced-sodium). Regular tomato and vegetable juice (not low-sodium or reduced-sodium). Angie Fava. Olives. Fruits Canned fruit in a light or heavy syrup. Fried fruit. Fruit in cream or butter sauce. Meat and other protein foods Fatty cuts of meat. Ribs. Fried meat. Berniece Salines. Sausage. Bologna and other processed lunch meats. Salami. Fatback. Hotdogs. Bratwurst. Salted nuts and seeds. Canned beans with added salt. Canned or smoked fish. Whole eggs or egg yolks. Chicken or [place] with skin. Dairy Whole or 2% milk, cream, and half-and-half. Whole or full-fat cream cheese. Whole-fat or sweetened yogurt. Full-fat cheese. Nondairy creamers. Whipped toppings. Processed cheese and cheese spreads. Fats and oils Butter. Stick margarine. Lard. Shortening. Ghee. Bacon fat. Tropical oils, such as coconut, palm kernel, or palm oil. Seasoning and other foods Salted popcorn and pretzels. Onion salt, garlic salt, seasoned salt, table salt, and sea salt. Worcestershire  sauce. Tartar sauce. Barbecue sauce. Teriyaki sauce. Soy sauce, including reduced-sodium. Steak sauce. Canned and packaged gravies. Fish sauce. Oyster sauce. Cocktail sauce. Horseradish that you find on the shelf. Ketchup. Mustard. Meat flavorings and tenderizers. Bouillon cubes. Hot sauce and Tabasco sauce. Premade or packaged marinades. Premade or packaged taco seasonings. Relishes. Regular salad dressings. Where to find more information:  National Heart, Lung, and [place]: https://wilson-eaton.com/  American Heart Association: www.heart.org Summary  The DASH eating plan is a healthy eating plan that has been shown to reduce high blood pressure (hypertension). It may also reduce your risk for type 2 diabetes, heart disease, and stroke.  With the DASH eating plan, you should limit salt (sodium) intake to 2,300 mg a day. If you have hypertension, you may need to reduce your sodium intake to 1,500 mg a day.  When on the DASH eating plan, aim to eat more fresh fruits and  vegetables, whole grains, lean proteins, low-fat dairy, and heart-healthy fats.  Work with your health care provider or diet and nutrition specialist (dietitian) to adjust your eating plan to your individual calorie needs. This information is not intended to replace advice given to you by your health care provider. Make sure you discuss any questions you have with your health care provider. Document Released: 02/13/2011 Document Revised: 02/18/2016 Document Reviewed: 02/18/2016 Elsevier Interactive Patient Education  [employer].

## 2018-02-23 NOTE — Progress Notes (Signed)
Subjective:    Patient ID: Taylor Cook, female    DOB: 1966/01/14, 52 y.o.   MRN: [id number]  HPI       52 yo female who was seen in follow-up of emergency department visit with diagnosis of hypertension.  Patient states that she did try hydrochlorothiazide which was prescribed but this medication caused her to have a pounding headache and therefore she stopped the medication.  Patient was seen here in the office as new patient on 12/31/2017 in follow-up of her emergency department visit.  At her emergency department visit, patient had also complained of having an area on her right collarbone in the middle of her chest which she felt was enlarged.  Patient states that this area has now gone down.  Patient denies any pain in this area no difficulty swallowing.  Patient has had no headaches or dizziness which she believes could be related to high blood pressure.  Patient is not currently on blood pressure medication.      Patient does have past medical history of asthma.  Patient states that she may have symptoms about twice every few months.  Patient has had no recent hospitalizations or emergency department visit secondary to asthma.  Patient states that her asthma is triggered by vigorous activity or if she also has a respiratory infection/cold.  Patient denies any nighttime awakenings secondary to shortness of breath, cough or wheezing.  Patient works in housekeeping and states that she sometimes has slight shortness of breath if she has been very busy.  Patient denies any current issues with fever, chills, wheezing or shortness of breath.  Patient does have issues with allergic rhinitis and has recurrent nasal congestion with postnasal drainage.  Patient denies any facial pressure or headache.  Patient has not currently on any medications for allergic rhinitis.  Patient reports that her only past surgical history was removal of a benign lump from the left breast as a teenager.  Patient has not had a  recent mammogram.  Patient does not currently smoke but did smoke in the past. Past Medical History:  Diagnosis Date  . Asthma   . Bronchitis   . Hypertension   . Seasonal allergies    Past Surgical History:  Procedure Laterality Date  . BREAST SURGERY     removal of 2 cyst  . TUBAL LIGATION     Family History  Problem Relation Age of Onset  . Stroke Other        fx  . Heart attack Other        fx  . Heart disease Other        fx  . Hypertension Other        fx  . Cancer Other        fx   Social History   Tobacco Use  . Smoking status: Former Smoker    Types: Cigarettes    Last attempt to quit: 10/12/2017    Years since quitting: 0.3  . Smokeless tobacco: Never Used  Substance Use Topics  . Alcohol use: No    Alcohol/week: 0.0 standard drinks  . Drug use: No  No Known Allergies    Review of Systems  Constitutional: Positive for fatigue (Occasional). Negative for chills and fever.  HENT: Positive for congestion and postnasal drip. Negative for dental problem, ear pain, facial swelling, sore throat, trouble swallowing and voice change.   Respiratory: Negative for cough, shortness of breath and wheezing.   Cardiovascular: Negative for chest pain,  palpitations and leg swelling.  Gastrointestinal: Negative for abdominal pain, constipation, diarrhea and nausea.  Endocrine: Negative for polydipsia, polyphagia and polyuria.  Genitourinary: Negative for dysuria and frequency.  Musculoskeletal: Negative for arthralgias, back pain, gait problem and joint swelling.  Neurological: Negative for dizziness and headaches.  Hematological: Negative for adenopathy. Does not bruise/bleed easily.       Objective:   Physical Exam Vitals signs and nursing note reviewed.  Constitutional:      General: She is not in acute distress.    Appearance: Normal appearance. She is normal weight. She is not ill-appearing.  HENT:     Right Ear: Tympanic membrane normal.     Left Ear:  Tympanic membrane normal.     Nose: Rhinorrhea present.     Comments: Mild edema/erythema of the nasal mucosa with mild clear nasal discharge    Mouth/Throat:     Mouth: Mucous membranes are moist.     Pharynx: Oropharynx is clear. Posterior oropharyngeal erythema (Mild posterior pharynx erythema) present. No oropharyngeal exudate.  Eyes:     General: No scleral icterus.    Conjunctiva/sclera: Conjunctivae normal.     Pupils: Pupils are equal, round, and reactive to light.  Neck:     Musculoskeletal: Normal range of motion and neck supple. No muscular tenderness.     Vascular: No carotid bruit.  Cardiovascular:     Rate and Rhythm: Normal rate and regular rhythm.     Pulses: Normal pulses.     Heart sounds: Normal heart sounds.  Pulmonary:     Effort: Pulmonary effort is normal. No respiratory distress.     Breath sounds: Normal breath sounds. No wheezing.  Abdominal:     General: Abdomen is flat.     Palpations: Abdomen is soft.     Tenderness: There is no abdominal tenderness. There is no right CVA tenderness, left CVA tenderness, guarding or rebound.  Musculoskeletal:        General: No tenderness.     Right lower leg: No edema.     Left lower leg: No edema.  Lymphadenopathy:     Cervical: No cervical adenopathy.  Neurological:     Mental Status: She is alert.  Psychiatric:        Mood and Affect: Mood normal.        Behavior: Behavior normal.        Thought Content: Thought content normal.        Judgment: Judgment normal.    BP 122/77 (BP Location: Left Arm, Patient Position: Sitting, Cuff Size: Normal)   Pulse 63   Temp 98.2 F (36.8 C) (Oral)   Resp 18   Ht 5\' 2"  (1.575 m)   Wt 157 lb (71.2 kg)   LMP 12/18/2015   SpO2 100%   BMI 28.72 kg/m         Assessment & Plan:  1. Mild intermittent asthma without complication Patient reports history of mild intermittent asthma.  Patient however does not have an albuterol inhaler at this time.  Prescription was sent  to patient's pharmacy to obtain albuterol inhaler.  Patient was offered influenza immunization at today's visit which she declined. - albuterol (PROVENTIL HFA;VENTOLIN HFA) 108 (90 Base) MCG/ACT inhaler; Inhale 2 puffs into the lungs every 6 (six) hours as needed for wheezing or shortness of breath.  Dispense: 1 Inhaler; Refill: 2  2. Allergic rhinitis, unspecified seasonality, unspecified trigger Prescription provided for Zyrtec to take 10 mg at bedtime to help with nasal congestion/postnasal  drainage from allergic rhinitis.  Patient was advised that she can break the tablet in half and take only 5 mg if she feels that the 10 mg dose causes excessive nasal dryness or excessive drowsiness the next day. - cetirizine (ZYRTEC) 10 MG tablet; Take 1 tablet (10 mg total) by mouth at bedtime. As needed for nasal congestion  Dispense: 30 tablet; Refill: 11  3. Screening for breast cancer Order was placed and patient given written order form to obtain mammogram. - MM Digital Screening; Future  *Patient's blood pressure was normal at today's visit without the use of medication.  Educational material provided regarding DASH diet  An After Visit Summary was printed and given to the patient.  Return in about 2 months (around 04/26/2018) for well exam-fasting.

## 2018-04-06 ENCOUNTER — Ambulatory Visit
Admission: RE | Admit: 2018-04-06 | Discharge: 2018-04-06 | Disposition: A | Discharge status: Home / Self Care | Payer: BLUE CROSS/BLUE SHIELD | Source: Ambulatory Visit | Attending: Family Medicine | Admitting: Family Medicine

## 2018-04-06 DIAGNOSIS — Z1239 Encounter for other screening for malignant neoplasm of breast: Secondary | ICD-10-CM

## 2018-04-14 ENCOUNTER — Other Ambulatory Visit: Payer: Self-pay | Admitting: Family Medicine

## 2018-04-14 DIAGNOSIS — R928 Other abnormal and inconclusive findings on diagnostic imaging of breast: Secondary | ICD-10-CM

## 2018-04-19 ENCOUNTER — Ambulatory Visit: Payer: BLUE CROSS/BLUE SHIELD

## 2018-04-19 ENCOUNTER — Ambulatory Visit
Admission: RE | Admit: 2018-04-19 | Discharge: 2018-04-19 | Disposition: A | Discharge status: Home / Self Care | Payer: BLUE CROSS/BLUE SHIELD | Source: Ambulatory Visit | Attending: Family Medicine | Admitting: Family Medicine

## 2018-04-19 DIAGNOSIS — R928 Other abnormal and inconclusive findings on diagnostic imaging of breast: Secondary | ICD-10-CM

## 2018-04-19 NOTE — Progress Notes (Signed)
Patient received results directly after exam. Patient completed the diagnostic on today. Patient reviewed and received a hard copy of the results prior to leaving the exam.

## 2018-04-29 ENCOUNTER — Telehealth: Payer: Self-pay | Admitting: *Deleted

## 2018-04-29 ENCOUNTER — Encounter: Payer: Medicaid Other | Admitting: Family Medicine

## 2018-04-29 DIAGNOSIS — Z1211 Encounter for screening for malignant neoplasm of colon: Secondary | ICD-10-CM

## 2018-04-29 NOTE — Telephone Encounter (Signed)
Mrs. Taylor Cook came in this morning for her appointment but was informed she would need to reschedule d/t provider illness. She requested a FIT- Fecal occult test to screen for Colon Cancer. She states she is 53 y.o. and should have had this at 53 y.o.Explained the collection process using a Teachback method. Pt advised to reschedule appointment for CPE with PCP. Pt verbalized understanding.

## 2018-06-03 ENCOUNTER — Encounter: Payer: BLUE CROSS/BLUE SHIELD | Admitting: Family Medicine

## 2018-06-18 ENCOUNTER — Encounter (HOSPITAL_COMMUNITY): Payer: Self-pay | Admitting: Emergency Medicine

## 2018-06-18 ENCOUNTER — Emergency Department (HOSPITAL_COMMUNITY)
Admission: EM | Admit: 2018-06-18 | Discharge: 2018-06-18 | Disposition: A | Discharge status: Home / Self Care | Payer: BLUE CROSS/BLUE SHIELD | Attending: Emergency Medicine | Admitting: Emergency Medicine

## 2018-06-18 ENCOUNTER — Emergency Department (HOSPITAL_COMMUNITY): Payer: BLUE CROSS/BLUE SHIELD

## 2018-06-18 ENCOUNTER — Other Ambulatory Visit: Payer: Self-pay

## 2018-06-18 DIAGNOSIS — Z79899 Other long term (current) drug therapy: Secondary | ICD-10-CM | POA: Insufficient documentation

## 2018-06-18 DIAGNOSIS — I10 Essential (primary) hypertension: Secondary | ICD-10-CM | POA: Diagnosis not present

## 2018-06-18 DIAGNOSIS — J449 Chronic obstructive pulmonary disease, unspecified: Secondary | ICD-10-CM | POA: Insufficient documentation

## 2018-06-18 DIAGNOSIS — Z7982 Long term (current) use of aspirin: Secondary | ICD-10-CM | POA: Diagnosis not present

## 2018-06-18 DIAGNOSIS — M25562 Pain in left knee: Secondary | ICD-10-CM | POA: Diagnosis present

## 2018-06-18 DIAGNOSIS — Z87891 Personal history of nicotine dependence: Secondary | ICD-10-CM | POA: Insufficient documentation

## 2018-06-18 MED ORDER — NAPROXEN 500 MG PO TABS: 500.0000 mg | ORAL_TABLET | Freq: Two times a day (BID) | ORAL | 0 refills | Status: DC

## 2018-06-18 NOTE — ED Provider Notes (Signed)
[facility] EMERGENCY DEPARTMENT Provider Note   CSN: [id number] Arrival date & time: 06/18/18  0802    History   Chief Complaint Chief Complaint  Patient presents with  . Knee Pain    HPI Taylor Cook is a 53 y.o. female with history of asthma, hypertension who presents with a several month history of on and off left knee pain.  Is worse with standing on it for long periods.  Patient states she does have to stand a lot at work.  She has been taking Tylenol and using heat which does seem to help.  Rest also helps it.  She reports intermittent swelling and redness.  She denies any fevers.  She denies any trauma or injury.  She has never been evaluated for this before.  She denies any numbness or tingling or calf pain.     HPI  Past Medical History:  Diagnosis Date  . Asthma   . Bronchitis   . Hypertension   . Seasonal allergies     Patient Active Problem List   Diagnosis Date Noted  . Asthma 01/12/2014  . COPD (chronic obstructive pulmonary disease) ([place]) 01/12/2014  . Chest pain 01/12/2014  . Family history of premature CAD 01/12/2014  . Smoking 01/12/2014    Past Surgical History:  Procedure Laterality Date  . BREAST CYST EXCISION Left age 26  . BREAST SURGERY     removal of 2 cyst  . TUBAL LIGATION       OB History   No obstetric history on file.      Home Medications    Prior to Admission medications   Medication Sig Start Date End Date Taking? Authorizing Provider  acetaminophen (TYLENOL) 500 MG tablet Take 1,000 mg by mouth every 6 (six) hours as needed for mild pain or fever.    [provider]  albuterol (PROVENTIL HFA;VENTOLIN HFA) 108 (90 Base) MCG/ACT inhaler Inhale 2 puffs into the lungs every 6 (six) hours as needed for wheezing or shortness of breath. 02/23/18   Fulp, Cammie, MD  aspirin 81 MG tablet Take 81 mg by mouth daily.    [provider]  cetirizine (ZYRTEC) 10 MG tablet Take 1 tablet (10 mg total) by  mouth at bedtime. As needed for nasal congestion 02/23/18   Fulp, Cammie, MD  hydrochlorothiazide (HYDRODIURIL) 25 MG tablet Take 1 tablet (25 mg total) by mouth daily. Patient not taking: Reported on 02/23/2018 12/31/17   Argentina Donovan, PA-C  ibuprofen (ADVIL,MOTRIN) 800 MG tablet Take 1 tablet (800 mg total) by mouth 3 (three) times daily. 04/24/16   Nona Dell, PA-C  naproxen (NAPROSYN) 500 MG tablet Take 1 tablet (500 mg total) by mouth 2 (two) times daily. 06/18/18   Frederica Kuster, PA-C    Family History Family History  Problem Relation Age of Onset  . Stroke Other        fx  . Heart attack Other        fx  . Heart disease Other        fx  . Hypertension Other        fx  . Cancer Other        fx    Social History Social History   Tobacco Use  . Smoking status: Former Smoker    Types: Cigarettes    Last attempt to quit: 10/12/2017    Years since quitting: 0.6  . Smokeless tobacco: Never Used  Substance Use Topics  .  Alcohol use: No    Alcohol/week: 0.0 standard drinks  . Drug use: No     Allergies   Patient has no known allergies.   Review of Systems Review of Systems  Constitutional: Negative for fever.  Musculoskeletal: Positive for arthralgias and joint swelling.  Neurological: Negative for numbness.     Physical Exam Updated Vital Signs BP 130/85 (BP Location: Right Arm)   Pulse 88   Temp 98.1 F (36.7 C) (Oral)   Resp 16   Ht 5' (1.524 m)   Wt 72.6 kg   LMP 06/04/2018 (Exact Date)   SpO2 99%   BMI 31.25 kg/m   Physical Exam Vitals signs and nursing note reviewed.  Constitutional:      General: She is not in acute distress.    Appearance: She is well-developed. She is not diaphoretic.  HENT:     Head: Normocephalic and atraumatic.     Mouth/Throat:     Pharynx: No oropharyngeal exudate.  Eyes:     General: No scleral icterus.       Right eye: No discharge.        Left eye: No discharge.     Conjunctiva/sclera:  Conjunctivae normal.     Pupils: Pupils are equal, round, and reactive to light.  Neck:     Musculoskeletal: Normal range of motion and neck supple.     Thyroid: No thyromegaly.  Cardiovascular:     Rate and Rhythm: Normal rate and regular rhythm.     Heart sounds: Normal heart sounds. No murmur. No friction rub. No gallop.   Pulmonary:     Effort: Pulmonary effort is normal. No respiratory distress.     Breath sounds: Normal breath sounds. No stridor. No wheezing or rales.  Abdominal:     General: Bowel sounds are normal. There is no distension.     Palpations: Abdomen is soft.     Tenderness: There is no abdominal tenderness. There is no guarding or rebound.  Musculoskeletal:     Comments: L knee: Medial tenderness to the left knee with very mild edema medially; no erythema or warmth; no pain with passive range of motion; some pain with anterior drawer test, negative posterior drawer test, negative McMurray's, no pain or laxity with varus or valgus stress  Lymphadenopathy:     Cervical: No cervical adenopathy.  Skin:    General: Skin is warm and dry.     Coloration: Skin is not pale.     Findings: No rash.  Neurological:     Mental Status: She is alert.     Coordination: Coordination normal.      ED Treatments / Results  Labs (all labs ordered are listed, but only abnormal results are displayed) Labs Reviewed - No data to display  EKG None  Radiology Dg Knee Complete 4 Views Left  Result Date: 06/18/2018 CLINICAL DATA:  Left knee pain and swelling EXAM: LEFT KNEE - COMPLETE 4+ VIEW COMPARISON:  None. FINDINGS: No evidence of fracture, dislocation, or joint effusion. No evidence of arthropathy or other focal bone abnormality. Soft tissues are unremarkable. IMPRESSION: No acute osseous injury of the left knee. Electronically Signed   By: Kathreen Devoid   On: 06/18/2018 08:56    Procedures Procedures (including critical care time)  Medications Ordered in ED Medications -  No data to display   Initial Impression / Assessment and Plan / ED Course  I have reviewed the triage vital signs and the nursing notes.  Pertinent labs &  imaging results that were available during my care of the patient were reviewed by me and considered in my medical decision making (see chart for details).        Patient presents with several months of intermittent left knee pain.  X-ray is negative.  There are no signs of septic joint at this time.  Suspect patellofemoral syndrome or other soft tissue, overuse injury.  Patient given knee sleeve.  Recommended ice, elevation, Naprosyn, Tylenol.  Patient denies history of GI bleed or concern for pregnancy.  Patient will be given referral to orthopedics if symptoms continue.  Return precautions discussed.  Patient understands and agrees with plan.  Patient vital stable throughout ED course and discharged in satisfactory condition.  Final Clinical Impressions(s) / ED Diagnoses   Final diagnoses:  Acute pain of left knee    ED Discharge Orders         Ordered    naproxen (NAPROSYN) 500 MG tablet  2 times daily     06/18/18 0906           Frederica Kuster, PA-C 06/18/18 8118    Virgel Manifold, MD 06/18/18 [phone]

## 2018-06-18 NOTE — ED Triage Notes (Signed)
Pt reports she is having left knee swelling that comes and goes. Pt reports she is on her feet all the time. Pt denies any trauma to the knee.

## 2018-06-18 NOTE — Discharge Instructions (Signed)
Take Naprosyn twice daily for your knee pain and swelling.  You can alternate with Tylenol in between.  Use ice 3-4 times daily alternating 20 minutes on, 20 minutes off.  Wear your knee sleeve for support, especially when you are walking or standing a lot.  Please follow-up with orthopedic doctor for further evaluation and treatment of your knee pain.  Please return to the emergency department if you develop any fever, significant increase in swelling, pain, or redness, or any other new or concerning symptoms.

## 2018-07-06 ENCOUNTER — Other Ambulatory Visit: Payer: Self-pay

## 2018-07-06 ENCOUNTER — Telehealth: Payer: Self-pay | Admitting: Family Medicine

## 2018-07-06 ENCOUNTER — Ambulatory Visit: Payer: Self-pay | Admitting: *Deleted

## 2018-07-06 VITALS — BP 181/108 | HR 92

## 2018-07-06 DIAGNOSIS — I10 Essential (primary) hypertension: Secondary | ICD-10-CM

## 2018-07-06 DIAGNOSIS — Z0130 Encounter for examination of blood pressure without abnormal findings: Secondary | ICD-10-CM

## 2018-07-06 NOTE — Telephone Encounter (Signed)
If patient's blood pressure has been that high she needs an in-person appointment and probably on Wednesday-please see if you can schedule patient to come in Wed or go to urgent care to have her BP treated if she cannot come into the office

## 2018-07-06 NOTE — Telephone Encounter (Signed)
Occupational therapy called to report  bp was 181/108 HR 92.  Office will be closed tomorrow Scheduled an appt for Thurs. For patient. Please follow up

## 2018-07-06 NOTE — Telephone Encounter (Signed)
Spoke to Recruitment consultant, Hildred Alamin with [employer]. Per patient she is not taking any medication for blood pressure. She was prescribed HCTZ but self d/c'ed.   She has an appointment scheduled for 07/07/2018- televisit.  Per Hildred Alamin, she will try to have a more recent BP reading prior to the appointment.

## 2018-07-06 NOTE — Progress Notes (Signed)
Walk in pt requesting BP check. She sts she has had intermittent dizziness and chest pain this morning. Pain was in L chest, squeezing sensation. 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 Lasted only about a minute and resolved with rest. Sts that is the only episode of chest pain she has had. No radiation of pain, diaphoresis, or ShOB. Dizziness/"wooziness" intermittent and only about a minute at a time as well. This occurs more frequently, a few times a day, over the past 3-4 days.  Sts due to these sx, she believes her BP is up. Reports she was on medicine for a little while but was taken off of it earlier this year because her BP was too labile. Per chart review, this was likely HCTZ but she reported to pcp that it caused pounding HAs and she stopped taking it.  BP elevated today. Denied any sx while in clinic. Sts she was supposed to have annual physical with pcp yesterday but did not receive any communication regarding confirmation/visit visit/rescheduling from office so she did not go. Has not been able to get through on phone to speak to anyone in the office. RN called and was informed physicals were cancelled, only acute care appts were being seen at the discretion of the provider. They are deciding on in office/virtual a day or two before the appt when the nurse calls the pt to confirm. Appt scheduled for pt Thurs 4/30 at 9:30a. Mode of visit unknown at this time.  Pt informed of appt and pending call for type of visit. Will plan to recheck BP in clinic 4/30 prior to appt if virtual visit. Reviewed ER precautions with pt. She verbalized understanding and agreement and had no further questions/concerns.

## 2018-07-06 NOTE — Telephone Encounter (Signed)
LMOVM  of  patient and Hildred Alamin, Therapist, sports from [employer] [facility].

## 2018-07-06 NOTE — Telephone Encounter (Signed)
Spoke to [place], Therapist, sports, Recruitment consultant with [employer] to inform of the change for Taylor Cook's appointment time change.  Pt appointment is now 07/08/2018 at 1:50 with PCP- Dr. Chapman Fitch.  Per Nurse Hildred Alamin, patient confirmed. Phone calls to patient would need to be made in the afternoon. Due to work schedule she is unable to answer her phone prior to then.  Message routed to PCP and CMA.

## 2018-07-07 NOTE — Telephone Encounter (Signed)
LMOVM- UTR.

## 2018-07-07 NOTE — Telephone Encounter (Signed)
Patient aware that appointment has been changed to in person visit. Verbalized understanding.

## 2018-07-07 NOTE — Telephone Encounter (Signed)
Called patient and phone rang once and went straight to voicemail. Staff [facility] and office number was provided.

## 2018-07-08 ENCOUNTER — Encounter: Payer: Self-pay | Admitting: Family Medicine

## 2018-07-08 ENCOUNTER — Ambulatory Visit: Payer: BLUE CROSS/BLUE SHIELD

## 2018-07-08 ENCOUNTER — Ambulatory Visit: Payer: BLUE CROSS/BLUE SHIELD | Attending: Family Medicine | Admitting: Family Medicine

## 2018-07-08 ENCOUNTER — Other Ambulatory Visit: Payer: Self-pay

## 2018-07-08 VITALS — BP 146/96 | HR 75 | Temp 98.0°F | Ht 60.0 in | Wt 162.4 lb

## 2018-07-08 DIAGNOSIS — J4520 Mild intermittent asthma, uncomplicated: Secondary | ICD-10-CM

## 2018-07-08 DIAGNOSIS — I10 Essential (primary) hypertension: Secondary | ICD-10-CM

## 2018-07-08 DIAGNOSIS — R079 Chest pain, unspecified: Secondary | ICD-10-CM | POA: Diagnosis not present

## 2018-07-08 DIAGNOSIS — Z79899 Other long term (current) drug therapy: Secondary | ICD-10-CM

## 2018-07-08 DIAGNOSIS — M25562 Pain in left knee: Secondary | ICD-10-CM

## 2018-07-08 DIAGNOSIS — G8929 Other chronic pain: Secondary | ICD-10-CM

## 2018-07-08 NOTE — Patient Instructions (Addendum)
Please have someone take you to the emergency department or call 911 if you have any additional episodes of chest pain. Please restart your blood pressure medications and also take a daily 81 mg aspirin (baby aspirin) Nonspecific Chest Pain Chest pain can be caused by many different conditions. Some causes of chest pain can be life-threatening. These will require treatment right away. Serious causes of chest pain include:  Heart attack.  A tear in the body's main blood vessel.  Redness and swelling (inflammation) around your heart.  Blood clot in your lungs. Other causes of chest pain may not be so serious. These include:  Heartburn.  Anxiety or stress.  Damage to bones or muscles in your chest.  Lung infections. Chest pain can feel like:  Pain or discomfort in your chest.  Crushing, pressure, aching, or squeezing pain.  Burning or tingling.  Dull or sharp pain that is worse when you move, cough, or take a deep breath.  Pain or discomfort that is also felt in your back, neck, jaw, shoulder, or arm, or pain that spreads to any of these areas. It is hard to know whether your pain is caused by something that is serious or something that is not so serious. So it is important to see your doctor right away if you have chest pain. Follow these instructions at home: Medicines  Take over-the-counter and prescription medicines only as told by your doctor.  If you were prescribed an antibiotic medicine, take it as told by your doctor. Do not stop taking the antibiotic even if you start to feel better. Lifestyle   Rest as told by your doctor.  Do not use any products that contain nicotine or tobacco, such as cigarettes, e-cigarettes, and chewing tobacco. If you need help quitting, ask your doctor.  Do not drink alcohol.  Make lifestyle changes as told by your doctor. These may include: ? Getting regular exercise. Ask your doctor what activities are safe for you. ? Eating a  heart-healthy diet. A diet and nutrition specialist (dietitian) can help you to learn healthy eating options. ? Staying at a healthy weight. ? Treating diabetes or high blood pressure, if needed. ? Lowering your stress. Activities such as yoga and relaxation techniques can help. General instructions  Pay attention to any changes in your symptoms. Tell your doctor about them or any new symptoms.  Avoid any activities that cause chest pain.  Keep all follow-up visits as told by your doctor. This is important. You may need more testing if your chest pain does not go away. Contact a doctor if:  Your chest pain does not go away.  You feel depressed.  You have a fever. Get help right away if:  Your chest pain is worse.  You have a cough that gets worse, or you cough up blood.  You have very bad (severe) pain in your belly (abdomen).  You pass out (faint).  You have either of these for no clear reason: ? Sudden chest discomfort. ? Sudden discomfort in your arms, back, neck, or jaw.  You have shortness of breath at any time.  You suddenly start to sweat, or your skin gets clammy.  You feel sick to your stomach (nauseous).  You throw up (vomit).  You suddenly feel lightheaded or dizzy.  You feel very weak or tired.  Your heart starts to beat fast, or it feels like it is skipping beats. These symptoms may be an emergency. Do not wait to see if the  symptoms will go away. Get medical help right away. Call your local emergency services (911 in the [place] Do not drive yourself to the hospital. Summary  Chest pain can be caused by many different conditions. The cause may be serious and need treatment right away. If you have chest pain, see your doctor right away.  Follow your doctor's instructions for taking medicines and making lifestyle changes.  Keep all follow-up visits as told by your doctor. This includes visits for any further testing if your chest pain does not go  away.  Be sure to know the signs that show that your condition has become worse. Get help right away if you have these symptoms. This information is not intended to replace advice given to you by your health care provider. Make sure you discuss any questions you have with your health care provider. Document Released: 08/13/2007 Document Revised: 08/27/2017 Document Reviewed: 08/27/2017 Elsevier Interactive Patient Education  2019 [employer].

## 2018-07-08 NOTE — Progress Notes (Signed)
Established Patient Office Visit  Subjective:  Patient ID: Taylor Cook, female    DOB: 12/06/1965  Age: 53 y.o. MRN: [id number]  CC: Hypertension/asthma follow-up and s/p ED visit on 06/18/2018 for  left knee pain  HPI Taylor Cook presents for follow-up of elevated blood pressure at work on Tuesday at work and per patient she had sharp pain in her chest and started shaking. Per patient she rested for about 10 minutes then had one of the nurses to check her Blood pressure and it was high. Per patient the nurse from there called this office and someone told her to come in today. Patient states that she did tell the nurses that she was had an episode of chest pain along with not feeling quite right. She denies any headaches. She did feel sweaty during the episode of chest pain but states that she is often sweaty. She denies any nausea. Chest pain was sharp and stabbing. She has had no further chest pain. She works in housekeeping/janitorial services but she had not done anything strenuous at the time of her chest pain episode. She does have a family history of her father having a heart attack at about the ago of 59 and he had also had a few strokes.        Patient reports that she thought that someone from here told her that she did not need to take her blood pressure anymore the last time that she was here because her BP was normal therefore she has not been any medication for her blood pressure. She does not feel that she has had any headaches related to her blood pressure but had not monitored/checked her BP until the recent episode at work.        She has also had recurrent pain in her left knee for the past month. She was seen in the ED on  06/18/18 and prescribed naproxen which she has been taking with minimal relief of her knee pain. She has felt at time that her knee has been swollen. She has pain on the outside of her left knee that is sharp and sometimes burning and if worse if she has been  sitting and then stands up to walk. Patient also increased pain with walking. Pain is a 7-8 on a 0-10 scale. She recalls no specific injury to her knee.       She feels that her asthma has been well controlled. She has had no cough, chest tightness or wheezing. She has had some increase in nasal congestion related to seasonal allergies.   Past Medical History:  Diagnosis Date  . Asthma   . Bronchitis   . Hypertension   . Seasonal allergies     Past Surgical History:  Procedure Laterality Date  . BREAST CYST EXCISION Left age 79  . BREAST SURGERY     removal of 2 cyst  . TUBAL LIGATION      Family History  Problem Relation Age of Onset  . Stroke Other        fx  . Heart attack Other        fx  . Heart disease Other        fx  . Hypertension Other        fx  . Cancer Other        fx   Social History   Tobacco Use  . Smoking status: Former Smoker    Types: Cigarettes    Last attempt to  quit: 10/12/2017    Years since quitting: 0.7  . Smokeless tobacco: Never Used  Substance Use Topics  . Alcohol use: No    Alcohol/week: 0.0 standard drinks  . Drug use: No     Outpatient Medications Prior to Visit  Medication Sig Dispense Refill  . acetaminophen (TYLENOL) 500 MG tablet Take 1,000 mg by mouth every 6 (six) hours as needed for mild pain or fever.    Marland Kitchen albuterol (PROVENTIL HFA;VENTOLIN HFA) 108 (90 Base) MCG/ACT inhaler Inhale 2 puffs into the lungs every 6 (six) hours as needed for wheezing or shortness of breath. 1 Inhaler 2  . aspirin 81 MG tablet Take 81 mg by mouth daily.    . cetirizine (ZYRTEC) 10 MG tablet Take 1 tablet (10 mg total) by mouth at bedtime. As needed for nasal congestion 30 tablet 11  . hydrochlorothiazide (HYDRODIURIL) 25 MG tablet Take 1 tablet (25 mg total) by mouth daily. (Patient not taking: Reported on 02/23/2018) 90 tablet 1  . ibuprofen (ADVIL,MOTRIN) 800 MG tablet Take 1 tablet (800 mg total) by mouth 3 (three) times daily. 21 tablet 0  .  naproxen (NAPROSYN) 500 MG tablet Take 1 tablet (500 mg total) by mouth 2 (two) times daily. 30 tablet 0   No facility-administered medications prior to visit.     No Known Allergies  ROS Review of Systems  Constitutional: Positive for fatigue (mild). Negative for chills and fever.  HENT: Positive for congestion, postnasal drip, rhinorrhea and sneezing. Negative for sinus pressure, sinus pain and sore throat.   Respiratory: Negative for cough, shortness of breath and wheezing.   Cardiovascular: Positive for chest pain (episode x 1 this Tuesday). Negative for palpitations and leg swelling.  Gastrointestinal: Negative for abdominal pain, blood in stool, constipation, diarrhea, nausea and vomiting.  Endocrine: Negative for cold intolerance, heat intolerance, polydipsia, polyphagia and polyuria.  Genitourinary: Negative for dysuria and frequency.  Musculoskeletal: Positive for arthralgias and joint swelling.  Allergic/Immunologic: Positive for environmental allergies. Negative for immunocompromised state.  Neurological: Positive for headaches. Negative for dizziness.  Hematological: Negative for adenopathy. Does not bruise/bleed easily.      Objective:    Physical Exam  Constitutional: She is oriented to person, place, and time. She appears well-developed and well-nourished. No distress.  Neck: Neck supple. No thyromegaly present.  Cardiovascular: Normal rate and regular rhythm.  Pulmonary/Chest: Effort normal and breath sounds normal. She has no wheezes.  Abdominal: Soft. There is no abdominal tenderness. There is no rebound and no guarding.  Musculoskeletal:        General: Tenderness and edema present.     Comments: Appearance of mild, generalized edema to the left knee and patient with tenderness along the left joint line of the knee. Patient with a slight limp/abnormal gait  Lymphadenopathy:    She has no cervical adenopathy.  Neurological: She is alert and oriented to person,  place, and time. No cranial nerve deficit.  Skin: Skin is warm and dry.  Psychiatric: She has a normal mood and affect. Her behavior is normal. Judgment and thought content normal.  Nursing note and vitals reviewed.  BP (!) 146/96 (BP Location: Right Arm, Cuff Size: Large)   Pulse 75   Temp 98 F (36.7 C) (Oral)   Ht 5' (1.524 m)   Wt 162 lb 6.4 oz (73.7 kg)   SpO2 93%   BMI 31.72 kg/m   Wt Readings from Last 3 Encounters:  06/18/18 160 lb (72.6 kg)  02/23/18 157  lb (71.2 kg)  12/31/17 157 lb (71.2 kg)     Health Maintenance Due  Topic Date Due  . HIV Screening  02/10/1981  . TETANUS/TDAP  02/10/1985  . PAP SMEAR-Modifier  02/11/1987  . COLONOSCOPY  02/11/2016      Lab Results  Component Value Date   TSH 0.448 12/11/2017   Lab Results  Component Value Date   WBC 4.5 12/11/2017   HGB 14.0 12/11/2017   HCT 41.8 12/11/2017   MCV 94.1 12/11/2017   PLT 248 12/11/2017   Lab Results  Component Value Date   NA 141 12/31/2017   K 3.4 (L) 12/31/2017   CO2 24 12/31/2017   GLUCOSE 81 12/31/2017   BUN 22 12/31/2017   CREATININE 0.89 12/31/2017   BILITOT 0.7 10/02/2017   ALKPHOS 90 10/02/2017   AST 19 10/02/2017   ALT 18 10/02/2017   PROT 7.5 10/02/2017   ALBUMIN 3.9 10/02/2017   CALCIUM 9.4 12/31/2017   ANIONGAP 9 12/11/2017   Lab Results  Component Value Date   CHOL 173 01/12/2014   Lab Results  Component Value Date   HDL 63.20 01/12/2014   Lab Results  Component Value Date   LDLCALC 97 01/12/2014   Lab Results  Component Value Date   TRIG 64.0 01/12/2014   Lab Results  Component Value Date   CHOLHDL 3 01/12/2014   No results found for: HGBA1C    Assessment & Plan:  1. Essential hypertension Patient was asked to restart her blood pressure medication-HCTZ as she states that she still has plenty of medication at home. Patient states that she believed that she could stop her blood pressure medication because it was normal at her last visit but  she thinks that she may have been still taking the medication at that time and discussed with her that it may have been the medication that was controlling her BP. Patient is being referred to Cardiology in follow-up of her chest pain and elevated blood pressure. Please have BP rechecked at work or return to office in 1-2 weeks for nurse visit recheck of BP - Basic Metabolic Panel - Ambulatory referral to Cardiology  2. Mild intermittent asthma without complication Currently with controlled asthma and has an albuterol inhaler for as needed use - Basic Metabolic Panel  3. Chronic pain of left knee Patient with 1 month of left knee pain with swelling and will be referred to Orthopedics for further evaluation and treatment - AMB referral to orthopedics  4. Chest pain, unspecified type Patient is being referred to cardiology and has been asked to restart her blood pressure medication, start a daily 81 mg ASA and if chest pain re-occurs she should be seen at the ED for further evaluation. - Ambulatory referral to Cardiology  5. Encounter for long-term (current) use of medications BMP in follow-up of medication use and recent BMP - Basic Metabolic Panel  An After Visit Summary was printed and given to the patient.  Allergies as of 07/08/2018   No Known Allergies     Medication List       Accurate as of July 08, 2018 11:37 PM. Always use your most recent med list.        acetaminophen 500 MG tablet Commonly known as:  TYLENOL Take 1,000 mg by mouth every 6 (six) hours as needed for mild pain or fever.   albuterol 108 (90 Base) MCG/ACT inhaler Commonly known as:  VENTOLIN HFA Inhale 2 puffs into the lungs every 6 (  six) hours as needed for wheezing or shortness of breath.   aspirin 81 MG tablet Take 81 mg by mouth daily.   cetirizine 10 MG tablet Commonly known as:  ZYRTEC Take 1 tablet (10 mg total) by mouth at bedtime. As needed for nasal congestion   hydrochlorothiazide 25  MG tablet Commonly known as:  HYDRODIURIL Take 1 tablet (25 mg total) by mouth daily.   ibuprofen 800 MG tablet Commonly known as:  ADVIL Take 1 tablet (800 mg total) by mouth 3 (three) times daily.   naproxen 500 MG tablet Commonly known as:  NAPROSYN Take 1 tablet (500 mg total) by mouth 2 (two) times daily.       Follow-up: Return in about 6 weeks (around 08/19/2018) for HTN- nurse visit for recheck in 2-3 weeks; .   Antony Blackbird, MD

## 2018-07-08 NOTE — Progress Notes (Signed)
Follow up for blood pressure and left knee pain   Per pt the congestion med do not work and she stopped taking it

## 2018-07-09 LAB — BASIC METABOLIC PANEL WITH GFR
BUN/Creatinine Ratio: 20 (ref 9–23)
BUN: 16 mg/dL (ref 6–24)
CO2: 22 mmol/L (ref 20–29)
Calcium: 9.8 mg/dL (ref 8.7–10.2)
Chloride: 103 mmol/L (ref 96–106)
Creatinine, Ser: 0.79 mg/dL (ref 0.57–1.00)
GFR calc Af Amer: 100 mL/min/1.73
GFR calc non Af Amer: 86 mL/min/1.73
Glucose: 92 mg/dL (ref 65–99)
Potassium: 4.3 mmol/L (ref 3.5–5.2)
Sodium: 141 mmol/L (ref 134–144)

## 2018-07-22 ENCOUNTER — Telehealth: Payer: Self-pay | Admitting: Emergency Medicine

## 2018-07-22 ENCOUNTER — Ambulatory Visit: Payer: BLUE CROSS/BLUE SHIELD | Attending: Family Medicine | Admitting: Emergency Medicine

## 2018-07-22 ENCOUNTER — Other Ambulatory Visit: Payer: Self-pay

## 2018-07-22 ENCOUNTER — Ambulatory Visit: Payer: BLUE CROSS/BLUE SHIELD | Admitting: Orthopaedic Surgery

## 2018-07-22 VITALS — BP 155/91 | HR 73 | Temp 98.3°F | Resp 17

## 2018-07-22 DIAGNOSIS — M25562 Pain in left knee: Secondary | ICD-10-CM

## 2018-07-22 DIAGNOSIS — G8929 Other chronic pain: Secondary | ICD-10-CM | POA: Diagnosis not present

## 2018-07-22 DIAGNOSIS — I10 Essential (primary) hypertension: Secondary | ICD-10-CM

## 2018-07-22 MED ORDER — MELOXICAM 7.5 MG PO TABS: 7.5000 mg | ORAL_TABLET | Freq: Two times a day (BID) | ORAL | 2 refills | Status: DC | PRN

## 2018-07-22 MED ORDER — DICLOFENAC SODIUM 2 % TD SOLN: 2.0000 g | Freq: Two times a day (BID) | TRANSDERMAL | 3 refills | Status: DC | PRN

## 2018-07-22 NOTE — Telephone Encounter (Signed)
Did she indicate if she was taking her blood pressure medication daily? Can you please ask her to come in for an in-person office visit in about 2 weeks as her BP is still not controlled and she may need an additional medication. Please also ask if she was contacted about a cardiology appointment and if so when is she scheduled for this appointment

## 2018-07-22 NOTE — Telephone Encounter (Signed)
Patient arrived ambulatory, alert and orientated to clinic.  Patient is in clinic for blood pressure check.  Vitals BP: 155/91 HR: 73 SPO2: 100% RR: 17 TEMP: 98.3

## 2018-07-22 NOTE — Progress Notes (Signed)
Patient arrived ambulatory, alert and orientated to clinic.  Patient is in clinic for blood pressure check.  Vitals BP: 155/91 HR: 73 SPO2: 100% RR: 17 TEMP:: 98.3

## 2018-07-22 NOTE — Progress Notes (Signed)
Office Visit Note   Patient: Taylor Cook           Date of Birth: Oct 23, 1965           MRN: [id number] Visit Date: 07/22/2018              Requested by: Antony Blackbird, MD [place], [place] [id number] PCP: Antony Blackbird, MD   Assessment & Plan: Visit Diagnoses:  1. Chronic pain of left knee     Plan: Impression is left knee osteoarthritis with possible peds anserine bursitis.  Overall her symptoms are more consistent with osteoarthritis exacerbation.  I did offer cortisone injection but patient politely declined.  We gave her prescription for meloxicam and Pennsaid today.  Should she not improve she knows to give [place] a call anytime to get a cortisone injection.  Otherwise follow-up as needed.  Questions encouraged and answered.  Follow-Up Instructions: Return if symptoms worsen or fail to improve.   Orders:  No orders of the defined types were placed in this encounter.  Meds ordered this encounter  Medications  . meloxicam (MOBIC) 7.5 MG tablet    Sig: Take 1 tablet (7.5 mg total) by mouth 2 (two) times daily as needed for pain.    Dispense:  30 tablet    Refill:  2  . Diclofenac Sodium (PENNSAID) 2 % SOLN    Sig: Apply 2 g topically 2 (two) times daily as needed (to affected area).    Dispense:  112 g    Refill:  3      Procedures: No procedures performed   Clinical Data: No additional findings.   Subjective: Chief Complaint  Patient presents with  . Left Knee - Pain    Taylor Cook is a 53 year old female comes in with chronic left knee pain for several months.  She denies any injuries.  She has taken a combination of naproxen and Tylenol which do help.  She states that the pain is worse in the morning as well as stiffness.  Denies a history of gout.  The pain is a constant aching pain is worse with walking.  She does endorse some swelling with increased walking.  Denies any numbness and tingling.   Review of Systems  Constitutional: Negative.   HENT:  Negative.   Eyes: Negative.   Respiratory: Negative.   Cardiovascular: Negative.   Endocrine: Negative.   Musculoskeletal: Negative.   Neurological: Negative.   Hematological: Negative.   Psychiatric/Behavioral: Negative.   All other systems reviewed and are negative.    Objective: Vital Signs: There were no vitals taken for this visit.  Physical Exam Vitals signs and nursing note reviewed.  Constitutional:      Appearance: She is well-developed.  HENT:     Head: Normocephalic and atraumatic.  Neck:     Musculoskeletal: Neck supple.  Pulmonary:     Effort: Pulmonary effort is normal.  Abdominal:     Palpations: Abdomen is soft.  Skin:    General: Skin is warm.     Capillary Refill: Capillary refill takes less than 2 seconds.  Neurological:     Mental Status: She is alert and oriented to person, place, and time.  Psychiatric:        Behavior: Behavior normal.        Thought Content: Thought content normal.        Judgment: Judgment normal.     Ortho Exam Left knee exam shows no joint effusion.  Normal range of motion.  Collaterals and cruciates are stable. She has no joint line tenderness.  She is slightly tender over the Pez anserine bursa. Specialty Comments:  No specialty comments available.  Imaging: No results found.   PMFS History: Patient Active Problem List   Diagnosis Date Noted  . Asthma 01/12/2014  . COPD (chronic obstructive pulmonary disease) ([place]) 01/12/2014  . Chest pain 01/12/2014  . Family history of premature CAD 01/12/2014  . Smoking 01/12/2014   Past Medical History:  Diagnosis Date  . Asthma   . Bronchitis   . Hypertension   . Seasonal allergies     Family History  Problem Relation Age of Onset  . Stroke Other        fx  . Heart attack Other        fx  . Heart disease Other        fx  . Hypertension Other        fx  . Cancer Other        fx    Past Surgical History:  Procedure Laterality Date  . BREAST CYST EXCISION  Left age 45  . BREAST SURGERY     removal of 2 cyst  . TUBAL LIGATION     Social History   Occupational History  . Not on file  Tobacco Use  . Smoking status: Former Smoker    Types: Cigarettes    Last attempt to quit: 10/12/2017    Years since quitting: 0.7  . Smokeless tobacco: Never Used  Substance and Sexual Activity  . Alcohol use: No    Alcohol/week: 0.0 standard drinks  . Drug use: No  . Sexual activity: Not Currently

## 2018-07-23 ENCOUNTER — Telehealth: Payer: Self-pay | Admitting: Emergency Medicine

## 2018-07-23 NOTE — Telephone Encounter (Signed)
Nurse called the patient's home phone number but received no answer and message was left on the voicemail for the patient to call back.  Return phone number given. 

## 2018-07-26 NOTE — Telephone Encounter (Signed)
Nurse called the patient's home phone number but received no answer and message was left on the voicemail for the patient to call back.  Return phone number given.  Told to call and make an appointment in four weeks.

## 2018-07-26 NOTE — Telephone Encounter (Signed)
Nurse called the patient's home phone number but received no answer and message was left on the voicemail for the patient to call back.  Return phone number given. 

## 2018-08-06 ENCOUNTER — Telehealth: Payer: BLUE CROSS/BLUE SHIELD | Admitting: Cardiovascular Disease

## 2018-08-06 ENCOUNTER — Encounter: Payer: BLUE CROSS/BLUE SHIELD | Admitting: Family Medicine

## 2018-08-10 ENCOUNTER — Telehealth: Payer: Self-pay

## 2018-08-10 NOTE — Telephone Encounter (Signed)
Virtual Visit Pre-Appointment Phone Call TELEPHONE CALL NOTE  Taylor Cook has been deemed a candidate for a follow-up tele-health visit to limit community exposure during the Covid-19 pandemic. I spoke with the patient via phone to ensure availability of phone/video source, confirm preferred email & phone number, and discuss instructions and expectations.  I reminded Taylor Cook to be prepared with any vital sign and/or heart rhythm information that could potentially be obtained via home monitoring, at the time of her visit. I reminded Taylor Cook to expect a phone call prior to her visit.  Patient agrees to consent below.  Cleon Gustin, RN 08/10/2018 9:38 AM    FULL LENGTH CONSENT FOR TELE-HEALTH VISIT   I hereby voluntarily request, consent and authorize [facility] and its employed or contracted physicians, physician assistants, nurse practitioners or other licensed health care professionals (the Practitioner), to provide me with telemedicine health care services (the "Services") as deemed necessary by the treating Practitioner. I acknowledge and consent to receive the Services by the Practitioner via telemedicine. I understand that the telemedicine visit will involve communicating with the Practitioner through live audiovisual communication technology and the disclosure of certain medical information by electronic transmission. I acknowledge that I have been given the opportunity to request an in-person assessment or other available alternative prior to the telemedicine visit and am voluntarily participating in the telemedicine visit.  I understand that I have the right to withhold or withdraw my consent to the use of telemedicine in the course of my care at any time, without affecting my right to future care or treatment, and that the Practitioner or I may terminate the telemedicine visit at any time. I understand that I have the right to inspect all information obtained and/or  recorded in the course of the telemedicine visit and may receive copies of available information for a reasonable fee.  I understand that some of the potential risks of receiving the Services via telemedicine include:  Marland Kitchen Delay or interruption in medical evaluation due to technological equipment failure or disruption; . Information transmitted may not be sufficient (e.g. poor resolution of images) to allow for appropriate medical decision making by the Practitioner; and/or  . In rare instances, security protocols could fail, causing a breach of personal health information.  Furthermore, I acknowledge that it is my responsibility to provide information about my medical history, conditions and care that is complete and accurate to the best of my ability. I acknowledge that Practitioner's advice, recommendations, and/or decision may be based on factors not within their control, such as incomplete or inaccurate data provided by me or distortions of diagnostic images or specimens that may result from electronic transmissions. I understand that the practice of medicine is not an exact science and that Practitioner makes no warranties or guarantees regarding treatment outcomes. I acknowledge that I will receive a copy of this consent concurrently upon execution via email to the email address I last provided but may also request a printed copy by calling the office of [facility].    I understand that my insurance will be billed for this visit.   I have read or had this consent read to me. . I understand the contents of this consent, which adequately explains the benefits and risks of the Services being provided via telemedicine.  . I have been provided ample opportunity to ask questions regarding this consent and the Services and have had my questions answered to my satisfaction. . I give my informed  consent for the services to be provided through the use of telemedicine in my medical care  By participating  in this telemedicine visit I agree to the above.

## 2018-08-11 NOTE — Progress Notes (Signed)
Virtual Visit via Video Note   This visit type was conducted due to national recommendations for restrictions regarding the COVID-19 Pandemic (e.g. social distancing) in an effort to limit this patient's exposure and mitigate transmission in our community.  Due to her co-morbid illnesses, this patient is at least at moderate risk for complications without adequate follow up.  This format is felt to be most appropriate for this patient at this time.  All issues noted in this document were discussed and addressed.  A limited physical exam was performed with this format.  Please refer to the patient's chart for her consent to telehealth for [facility].   Date:  08/13/2018   ID:  [name], DOB 05/21/1965, MRN [id number]  Patient Location: Home Provider Location: Home  PCP:  Antony Blackbird, MD  Cardiologist:  No primary care provider on file. [place] Electrophysiologist:  None   Evaluation Performed:  Consultation - Kalayah Leske was referred by Dr. Chapman Fitch for the evaluation of chest pain.  Chief Complaint:  Chest pain  History of Present Illness:    Deardra Hinkley is a 53 y.o. 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  A few weeks ago, she had a sharp pain in her chest that went down her arm.  SHe went to the nurses dept.  She was feeling dizzy and she thought her BP was increased. She was on BP meds, and they were increased.   The patient does not have symptoms concerning for COVID-19 infection (fever, chills, cough, or new shortness of breath).    Past Medical History:  Diagnosis Date  . Asthma   . Bronchitis   . Hypertension   . Seasonal allergies    Past Surgical History:  Procedure Laterality Date  . BREAST CYST EXCISION Left age 28  . BREAST SURGERY     removal of 2 cyst  . TUBAL LIGATION       Current Meds  Medication Sig  . acetaminophen (TYLENOL) 500 MG tablet Take 1,000 mg by mouth every 6 (six) hours as needed for mild pain or  fever.  Marland Kitchen albuterol (PROVENTIL HFA;VENTOLIN HFA) 108 (90 Base) MCG/ACT inhaler Inhale 2 puffs into the lungs every 6 (six) hours as needed for wheezing or shortness of breath.  . cetirizine (ZYRTEC) 10 MG tablet Take 1 tablet (10 mg total) by mouth at bedtime. As needed for nasal congestion  . Diclofenac Sodium (PENNSAID) 2 % SOLN Apply 2 g topically 2 (two) times daily as needed (to affected area).  . hydrochlorothiazide (HYDRODIURIL) 25 MG tablet Take 1 tablet (25 mg total) by mouth daily.  Marland Kitchen ibuprofen (ADVIL,MOTRIN) 800 MG tablet Take 1 tablet (800 mg total) by mouth 3 (three) times daily.  . naproxen (NAPROSYN) 500 MG tablet Take 1 tablet (500 mg total) by mouth 2 (two) times daily.     Allergies:   Patient has no known allergies.   Social History   Tobacco Use  . Smoking status: Former Smoker    Types: Cigarettes    Last attempt to quit: 10/12/2017    Years since quitting: 0.8  . Smokeless tobacco: Never Used  Substance Use Topics  . Alcohol use: No    Alcohol/week: 0.0 standard drinks  . Drug use: No     Family Hx: The patient's family history includes Cancer in an other family member; Heart attack in an other family member; Heart disease in an other family member; Hypertension in an other family member;  Stroke in an other family member.  ROS:   Please see the history of present illness.    Not checking BP regularly, has difficult taking time off from work. All other systems reviewed and are negative.   Prior CV studies:   The following studies were reviewed today:    Labs/Other Tests and Data Reviewed:    EKG:  An ECG dated 7/19 was personally reviewed today and demonstrated:  NSR, nonspecfic ST changes  Recent Labs: 10/02/2017: ALT 18 12/11/2017: Hemoglobin 14.0; Platelets 248; TSH 0.448 07/08/2018: BUN 16; Creatinine, Ser 0.79; Potassium 4.3; Sodium 141   Recent Lipid Panel Lab Results  Component Value Date/Time   CHOL 173 01/12/2014 09:07 AM   TRIG 64.0  01/12/2014 09:07 AM   HDL 63.20 01/12/2014 09:07 AM   CHOLHDL 3 01/12/2014 09:07 AM   LDLCALC 97 01/12/2014 09:07 AM    Wt Readings from Last 3 Encounters:  08/13/18 154 lb (69.9 kg)  07/08/18 162 lb 6.4 oz (73.7 kg)  06/18/18 160 lb (72.6 kg)     Objective:    Vital Signs:  Ht 5' (1.524 m)   Wt 154 lb (69.9 kg)   BMI 30.08 kg/m    VITAL SIGNS:  reviewed GEN:  no acute distress RESPIRATORY:  normal respiratory effort, symmetric expansion PSYCH:  normal affect exam limited by video format  ASSESSMENT & PLAN:    1. Chest pain/arm numbness: Plan for Coronary CT scan.  Chest pain and recurrent arm pain/numbness 2. Family h/o CAD: Sister with a stent. 3. HTN: COntinue HCTZ. SHe needs to check BP at home.   COVID-19 Education: The signs and symptoms of COVID-19 were discussed with the patient and how to seek care for testing (follow up with PCP or arrange E-visit).  The importance of social distancing was discussed today.  Time:   Today, I have spent 25 minutes with the patient with telehealth technology discussing the above problems.     Medication Adjustments/Labs and Tests Ordered: Current medicines are reviewed at length with the patient today.  Concerns regarding medicines are outlined above.   Tests Ordered: Orders Placed This Encounter  Procedures  . CT CORONARY MORPH W/CTA COR W/SCORE W/CA W/CM &/OR WO/CM  . CT CORONARY FRACTIONAL FLOW RESERVE DATA PREP  . CT CORONARY FRACTIONAL FLOW RESERVE FLUID ANALYSIS  . Basic metabolic panel    Medication Changes: No orders of the defined types were placed in this encounter.   Disposition:  Follow up in 2 week(s) for coronary CT  Signed, Larae Grooms, MD  08/13/2018 1:30 PM    [place]

## 2018-08-13 ENCOUNTER — Telehealth (INDEPENDENT_AMBULATORY_CARE_PROVIDER_SITE_OTHER): Payer: BLUE CROSS/BLUE SHIELD | Admitting: Interventional Cardiology

## 2018-08-13 ENCOUNTER — Other Ambulatory Visit: Payer: Self-pay

## 2018-08-13 ENCOUNTER — Encounter: Payer: Self-pay | Admitting: Interventional Cardiology

## 2018-08-13 VITALS — Ht 60.0 in | Wt 154.0 lb

## 2018-08-13 DIAGNOSIS — R072 Precordial pain: Secondary | ICD-10-CM | POA: Diagnosis not present

## 2018-08-13 DIAGNOSIS — Z8249 Family history of ischemic heart disease and other diseases of the circulatory system: Secondary | ICD-10-CM | POA: Diagnosis not present

## 2018-08-13 DIAGNOSIS — Z01812 Encounter for preprocedural laboratory examination: Secondary | ICD-10-CM

## 2018-08-13 DIAGNOSIS — I10 Essential (primary) hypertension: Secondary | ICD-10-CM | POA: Diagnosis not present

## 2018-08-13 NOTE — Patient Instructions (Signed)
Medication Instructions:  Your physician recommends that you continue on your current medications as directed. Please refer to the Current Medication list given to you today.  If you need a refill on your cardiac medications before your next appointment, please call your pharmacy.   Lab work: Your physician recommends that you return for lab work Artist) prior to Cardiac CT   If you have labs (blood work) drawn today and your tests are completely normal, you will receive your results only by: Marland Kitchen MyChart Message (if you have MyChart) OR . A paper copy in the mail If you have any lab test that is abnormal or we need to change your treatment, we will call you to review the results.  Testing/Procedures: Your physician has requested that you have cardiac CT. Cardiac computed tomography (CT) is a painless test that uses an x-ray machine to take clear, detailed pictures of your heart. For further information please visit [email or web address]. Please follow instruction sheet as given.  Follow-Up: . Based on test results  Any Other Special Instructions Will Be Listed Below (If Applicable).  Call back to report Heart Rate and we will review Cardiac CT instructions with you at that time

## 2018-08-16 ENCOUNTER — Ambulatory Visit: Payer: Self-pay | Admitting: *Deleted

## 2018-08-16 ENCOUNTER — Other Ambulatory Visit: Payer: Self-pay

## 2018-08-16 VITALS — BP 140/94 | HR 97

## 2018-08-16 DIAGNOSIS — I10 Essential (primary) hypertension: Secondary | ICD-10-CM

## 2018-08-16 DIAGNOSIS — Z0130 Encounter for examination of blood pressure without abnormal findings: Secondary | ICD-10-CM

## 2018-08-16 NOTE — Progress Notes (Signed)
Pt requesting BP checks daily for cardiology. Cardiac CT pending.   Per chart review, cardiologist is seeking HR prior to CT. This encounter routed to MD for review. Pt reports she is taking all medications as prescribed.

## 2018-08-17 ENCOUNTER — Ambulatory Visit: Payer: Self-pay | Admitting: *Deleted

## 2018-08-17 VITALS — BP 135/92 | HR 97

## 2018-08-17 DIAGNOSIS — Z0130 Encounter for examination of blood pressure without abnormal findings: Secondary | ICD-10-CM

## 2018-08-17 DIAGNOSIS — I10 Essential (primary) hypertension: Secondary | ICD-10-CM

## 2018-08-18 ENCOUNTER — Telehealth: Payer: Self-pay | Admitting: Interventional Cardiology

## 2018-08-18 NOTE — Telephone Encounter (Signed)
Patient called because she was told to monitor her HR and BP. She states that the nurse at her job has helped her , and the nurse has sent everything digitally to Dr. Irish Lack.  Please reach out to the patient if no records have been received

## 2018-08-18 NOTE — Telephone Encounter (Signed)
Left message for patient to call back  

## 2018-08-27 NOTE — Telephone Encounter (Signed)
Left message for patient to call back.   Patient needs Cardiac CT instructions and metoprolol sent in based on HR.

## 2018-08-30 ENCOUNTER — Telehealth: Payer: Self-pay | Admitting: Interventional Cardiology

## 2018-08-30 NOTE — Telephone Encounter (Signed)
Patient will need another pill called in she went to [facility] to have cardiac ct done today , test is next week

## 2018-08-30 NOTE — Telephone Encounter (Signed)
Left message for patient to call back  

## 2018-09-02 MED ORDER — METOPROLOL TARTRATE 100 MG PO TABS: ORAL_TABLET | ORAL | 0 refills | Status: DC

## 2018-09-02 NOTE — Telephone Encounter (Signed)
See phone note from 6/10

## 2018-09-02 NOTE — Telephone Encounter (Signed)
Spoke with patient. HR 94. Will use metoprolol 100 mg x1 prior to Cardiac CT. Instructions below reviewed with the patient. Patient verbalized understanding and thanked me for the call.  Please arrive at the [facility] main entrance of [facility] on ___ at ___  [facility] [address] [place], [place] [address] [phone]  Proceed to the [facility] Radiology Department (First Floor).  Please follow these instructions carefully (unless otherwise directed):   On the Night Before the Test: . Be sure to Drink plenty of water. . Do not consume any caffeinated/decaffeinated beverages or chocolate 12 hours prior to your test. . Do not take any antihistamines 12 hours prior to your test.   On the Day of the Test: . Drink plenty of water. Do not drink any water within one hour of the test. . Do not eat any food 4 hours prior to the test. . You may take your regular medications prior to the test.  . Take metoprolol (Lopressor) two hours prior to test. . HOLD Hydrochlorothiazide morning of the test.     After the Test: . Drink plenty of water. . After receiving IV contrast, you may experience a mild flushed feeling. This is normal. . On occasion, you may experience a mild rash up to 24 hours after the test. This is not dangerous. If this occurs, you can take Benadryl 25 mg and increase your fluid intake. . If you experience trouble breathing, this can be serious. If it is severe call 911 IMMEDIATELY. If it is mild, please call our office.

## 2018-09-03 ENCOUNTER — Telehealth (HOSPITAL_COMMUNITY): Payer: Self-pay | Admitting: Emergency Medicine

## 2018-09-03 NOTE — Telephone Encounter (Signed)
Left message on voicemail with name and callback number Tracer Gutridge RN Navigator Cardiac Imaging [facility] [phone] Office [phone] Cell  

## 2018-09-05 ENCOUNTER — Telehealth: Payer: Self-pay | Admitting: Medical

## 2018-09-05 MED ORDER — METOPROLOL TARTRATE 100 MG PO TABS: ORAL_TABLET | ORAL | 0 refills | Status: DC

## 2018-09-05 NOTE — Telephone Encounter (Signed)
Patient called requesting the 1x metoprolol prescription be sent to her pharmacy. Rx sent for 100mg  x1 dose to be taken 2 hours prior to her scheduled CT scan tomorrow.   Abigail Butts, PA-C 09/05/18; 10:05 AM

## 2018-09-06 ENCOUNTER — Other Ambulatory Visit: Payer: Self-pay

## 2018-09-06 ENCOUNTER — Encounter: Payer: BLUE CROSS/BLUE SHIELD | Admitting: *Deleted

## 2018-09-06 ENCOUNTER — Ambulatory Visit (HOSPITAL_COMMUNITY)
Admission: RE | Admit: 2018-09-06 | Discharge: 2018-09-06 | Disposition: A | Discharge status: Home / Self Care | Payer: BLUE CROSS/BLUE SHIELD | Source: Ambulatory Visit | Attending: Interventional Cardiology | Admitting: Interventional Cardiology

## 2018-09-06 ENCOUNTER — Ambulatory Visit (HOSPITAL_COMMUNITY): Payer: BLUE CROSS/BLUE SHIELD

## 2018-09-06 ENCOUNTER — Encounter (HOSPITAL_COMMUNITY): Payer: Self-pay

## 2018-09-06 DIAGNOSIS — Z8249 Family history of ischemic heart disease and other diseases of the circulatory system: Secondary | ICD-10-CM | POA: Insufficient documentation

## 2018-09-06 DIAGNOSIS — Z006 Encounter for examination for normal comparison and control in clinical research program: Secondary | ICD-10-CM

## 2018-09-06 DIAGNOSIS — R072 Precordial pain: Secondary | ICD-10-CM | POA: Diagnosis not present

## 2018-09-06 MED ORDER — NITROGLYCERIN 0.4 MG SL SUBL
0.8000 mg | SUBLINGUAL_TABLET | SUBLINGUAL | Status: DC | PRN
  Administered 2018-09-06: 0.8 mg via SUBLINGUAL

## 2018-09-06 MED ORDER — IOHEXOL 350 MG/ML SOLN
80.0000 mL | Freq: Once | INTRAVENOUS | Status: AC | PRN
  Administered 2018-09-06: 80 mL via INTRAVENOUS

## 2018-09-06 MED ORDER — NITROGLYCERIN 0.4 MG SL SUBL
SUBLINGUAL_TABLET | SUBLINGUAL | Status: AC
  Filled 2018-09-06: qty 8

## 2018-09-06 NOTE — Research (Signed)
CADFEM Informed Consent                  Subject Name:   Taylor Cook   Subject met inclusion and exclusion criteria.  The informed consent form, study requirements and expectations were reviewed with the subject and questions and concerns were addressed prior to the signing of the consent form.  The subject verbalized understanding of the trial requirements.  The subject agreed to participate in the CADFEM trial and signed the informed consent.  The informed consent was obtained prior to performance of any protocol-specific procedures for the subject.  A copy of the signed informed consent was given to the subject and a copy was placed in the subject's medical record.   [place] Chalmers, Research Assistant  09/06/2018 08:32 a.m.

## 2019-02-06 ENCOUNTER — Other Ambulatory Visit: Payer: Self-pay | Admitting: Physician Assistant

## 2019-02-06 DIAGNOSIS — I10 Essential (primary) hypertension: Secondary | ICD-10-CM

## 2019-02-17 ENCOUNTER — Emergency Department (HOSPITAL_COMMUNITY)
Admission: EM | Admit: 2019-02-17 | Discharge: 2019-02-18 | Disposition: A | Discharge status: Home / Self Care | Payer: BLUE CROSS/BLUE SHIELD | Attending: Emergency Medicine | Admitting: Emergency Medicine

## 2019-02-17 ENCOUNTER — Encounter (HOSPITAL_COMMUNITY): Payer: Self-pay

## 2019-02-17 DIAGNOSIS — R519 Headache, unspecified: Secondary | ICD-10-CM

## 2019-02-17 DIAGNOSIS — I10 Essential (primary) hypertension: Secondary | ICD-10-CM | POA: Diagnosis not present

## 2019-02-17 DIAGNOSIS — J449 Chronic obstructive pulmonary disease, unspecified: Secondary | ICD-10-CM | POA: Insufficient documentation

## 2019-02-17 DIAGNOSIS — Z79899 Other long term (current) drug therapy: Secondary | ICD-10-CM | POA: Diagnosis not present

## 2019-02-17 DIAGNOSIS — Z87891 Personal history of nicotine dependence: Secondary | ICD-10-CM | POA: Insufficient documentation

## 2019-02-17 NOTE — ED Triage Notes (Signed)
Pt states that she had a headache and checked her BP and it was elevated. Pt has been out of her BP meds for one week.

## 2019-02-18 ENCOUNTER — Encounter (HOSPITAL_COMMUNITY): Payer: Self-pay | Admitting: Radiology

## 2019-02-18 ENCOUNTER — Emergency Department (HOSPITAL_COMMUNITY): Payer: BLUE CROSS/BLUE SHIELD

## 2019-02-18 ENCOUNTER — Other Ambulatory Visit: Payer: Self-pay

## 2019-02-18 MED ORDER — PROCHLORPERAZINE EDISYLATE 10 MG/2ML IJ SOLN
10.0000 mg | Freq: Once | INTRAMUSCULAR | Status: AC
  Administered 2019-02-18: 02:00:00 10 mg via INTRAVENOUS
  Filled 2019-02-18: qty 2

## 2019-02-18 MED ORDER — METOPROLOL TARTRATE 25 MG PO TABS
100.0000 mg | ORAL_TABLET | Freq: Once | ORAL | Status: AC
  Administered 2019-02-18: 02:00:00 100 mg via ORAL
  Filled 2019-02-18: qty 4

## 2019-02-18 MED ORDER — HYDROCHLOROTHIAZIDE 25 MG PO TABS: 25.0000 mg | ORAL_TABLET | Freq: Every day | ORAL | 0 refills | Status: DC

## 2019-02-18 MED ORDER — HYDROCHLOROTHIAZIDE 25 MG PO TABS
25.0000 mg | ORAL_TABLET | Freq: Once | ORAL | Status: AC
  Administered 2019-02-18: 02:00:00 25 mg via ORAL
  Filled 2019-02-18: qty 1

## 2019-02-18 MED ORDER — DIPHENHYDRAMINE HCL 50 MG/ML IJ SOLN
12.5000 mg | Freq: Once | INTRAMUSCULAR | Status: AC
  Administered 2019-02-18: 02:00:00 12.5 mg via INTRAVENOUS
  Filled 2019-02-18: qty 1

## 2019-02-18 NOTE — ED Provider Notes (Signed)
[facility] EMERGENCY DEPARTMENT Provider Note   CSN: [id number] Arrival date & time: 02/17/19  2029     History Chief Complaint  Patient presents with  . Hypertension    Taylor Cook is a 53 y.o. female with a hx of asthma, hypertension, COPD presents to the Emergency Department complaining of gradual, persistent, progressively worsening frontal and right sided headache onset 5pm while at work. Associated symptoms include HTN.  Pt reports she had her job check her BP when her headache did not resolve on its own and it was high.  She denies dizziness, syncope, weakness, numbness, vision changes, vomiting.  Pt reports her headache has improved from a 9/10 at its peak intensity to a 4/10 at this time. Pt denies aggravating or alleviating factors.  Pt reports that normally she gets a similar headache when her BP is high.  She reports she normally just goes to bed, but was concerned about the intensity of her pain and her significantly elevated BP.  Pt reports she has been out of her HTN medications for more than a week.    The history is provided by the patient and medical records. No language interpreter was used.       Past Medical History:  Diagnosis Date  . Asthma   . Bronchitis   . Hypertension   . Seasonal allergies     Patient Active Problem List   Diagnosis Date Noted  . Asthma 01/12/2014  . COPD (chronic obstructive pulmonary disease) ([place]) 01/12/2014  . Chest pain 01/12/2014  . Family history of premature CAD 01/12/2014  . Smoking 01/12/2014    Past Surgical History:  Procedure Laterality Date  . BREAST CYST EXCISION Left age 34  . BREAST SURGERY     removal of 2 cyst  . TUBAL LIGATION       OB History   No obstetric history on file.     Family History  Problem Relation Age of Onset  . Stroke Other        fx  . Heart attack Other        fx  . Heart disease Other        fx  . Hypertension Other        fx  . Cancer Other        fx      Social History   Tobacco Use  . Smoking status: Former Smoker    Types: Cigarettes    Quit date: 10/12/2017    Years since quitting: 1.3  . Smokeless tobacco: Never Used  Substance Use Topics  . Alcohol use: No    Alcohol/week: 0.0 standard drinks  . Drug use: No    Home Medications Prior to Admission medications   Medication Sig Start Date End Date Taking? Authorizing Provider  albuterol (PROVENTIL HFA;VENTOLIN HFA) 108 (90 Base) MCG/ACT inhaler Inhale 2 puffs into the lungs every 6 (six) hours as needed for wheezing or shortness of breath. 02/23/18  Yes Fulp, Cammie, MD  cetirizine (ZYRTEC) 10 MG tablet Take 1 tablet (10 mg total) by mouth at bedtime. As needed for nasal congestion Patient taking differently: Take 10 mg by mouth at bedtime.  02/23/18  Yes Fulp, Cammie, MD  Diclofenac Sodium (PENNSAID) 2 % SOLN Apply 2 g topically 2 (two) times daily as needed (to affected area). Patient not taking: Reported on 02/18/2019 07/22/18   Leandrew Koyanagi, MD  hydrochlorothiazide (HYDRODIURIL) 25 MG tablet Take 1 tablet (25 mg total) by  mouth daily. 02/18/19   Lyzbeth Genrich, Jarrett Soho, PA-C  ibuprofen (ADVIL,MOTRIN) 800 MG tablet Take 1 tablet (800 mg total) by mouth 3 (three) times daily. Patient not taking: Reported on 02/18/2019 04/24/16   Nona Dell, PA-C  metoprolol tartrate (LOPRESSOR) 100 MG tablet Take 1 tablet 2 hours prior to your Cardiac CT Patient not taking: Reported on 02/18/2019 09/05/18   Abigail Butts., PA-C  naproxen (NAPROSYN) 500 MG tablet Take 1 tablet (500 mg total) by mouth 2 (two) times daily. Patient not taking: Reported on 02/18/2019 06/18/18   Frederica Kuster, PA-C    Allergies    Patient has no known allergies.  Review of Systems   Review of Systems  Constitutional: Negative for appetite change, diaphoresis, fatigue, fever and unexpected weight change.  HENT: Negative for mouth sores.   Eyes: Negative for visual disturbance.  Respiratory:  Negative for cough, chest tightness, shortness of breath and wheezing.   Cardiovascular: Negative for chest pain.  Gastrointestinal: Negative for abdominal pain, constipation, diarrhea, nausea and vomiting.  Endocrine: Negative for polydipsia, polyphagia and polyuria.  Genitourinary: Negative for dysuria, frequency, hematuria and urgency.  Musculoskeletal: Negative for back pain and neck stiffness.  Skin: Negative for rash.  Allergic/Immunologic: Negative for immunocompromised state.  Neurological: Positive for headaches. Negative for syncope and light-headedness.  Hematological: Does not bruise/bleed easily.  Psychiatric/Behavioral: Negative for sleep disturbance. The patient is not nervous/anxious.     Physical Exam Updated Vital Signs BP 139/90   Pulse (!) 53   Temp 98.4 F (36.9 C) (Oral)   Resp 16   SpO2 98%   Physical Exam Vitals and nursing note reviewed.  Constitutional:      General: She is not in acute distress.    Appearance: She is well-developed. She is not diaphoretic.  HENT:     Head: Normocephalic and atraumatic.  Eyes:     General: No scleral icterus.    Conjunctiva/sclera: Conjunctivae normal.     Pupils: Pupils are equal, round, and reactive to light.     Comments: No horizontal, vertical or rotational nystagmus  Neck:     Comments: Full active and passive ROM without pain No midline or paraspinal tenderness No nuchal rigidity or meningeal signs Cardiovascular:     Rate and Rhythm: Normal rate and regular rhythm.  Pulmonary:     Effort: Pulmonary effort is normal. No tachypnea or respiratory distress.     Comments: No increased work of breathing Abdominal:     Palpations: Abdomen is soft.     Tenderness: There is no abdominal tenderness. There is no guarding or rebound.  Musculoskeletal:        General: Normal range of motion.     Cervical back: Normal range of motion and neck supple.  Lymphadenopathy:     Cervical: No cervical adenopathy.   Skin:    General: Skin is warm and dry.     Findings: No rash.  Neurological:     Mental Status: She is alert and oriented to person, place, and time.     Cranial Nerves: No cranial nerve deficit.     Motor: No abnormal muscle tone.     Coordination: Coordination normal.     Comments: Mental Status:  Alert, oriented, thought content appropriate. Speech fluent without evidence of aphasia. Able to follow 2 step commands without difficulty.  Cranial Nerves:  II:  Peripheral visual fields grossly normal, pupils equal, round, reactive to light III,IV, VI: ptosis not present, extra-ocular motions intact bilaterally  V,VII: smile symmetric, facial light touch sensation equal VIII: hearing grossly normal bilaterally  IX,X: midline uvula rise  XI: bilateral shoulder shrug equal and strong XII: midline tongue extension  Motor:  5/5 in upper and lower extremities bilaterally including strong and equal grip strength and dorsiflexion/plantar flexion Sensory: light touch normal in all extremities.  Cerebellar: normal finger-to-nose with bilateral upper extremities Gait: normal gait and balance CV: distal pulses palpable throughout   Psychiatric:        Behavior: Behavior normal.        Thought Content: Thought content normal.        Judgment: Judgment normal.     ED Results / Procedures / Treatments   Labs (all labs ordered are listed, but only abnormal results are displayed) Labs Reviewed - No data to display  EKG None  Radiology CT Head Wo Contrast  Result Date: 02/18/2019 CLINICAL DATA:  Headache. EXAM: CT HEAD WITHOUT CONTRAST TECHNIQUE: Contiguous axial images were obtained from the base of the skull through the vertex without intravenous contrast. COMPARISON:  August 20, 2014 FINDINGS: Brain: No evidence of acute infarction, hemorrhage, hydrocephalus, extra-axial collection or mass lesion/mass effect. Vascular: No hyperdense vessel or unexpected calcification. Skull: Normal.  Negative for fracture or focal lesion. Sinuses/Orbits: No acute finding. Other: None. IMPRESSION: Normal CT head without contrast. Electronically Signed   By: Constance Holster M.D.   On: 02/18/2019 01:59    Procedures Procedures (including critical care time)  Medications Ordered in ED Medications  hydrochlorothiazide (HYDRODIURIL) tablet 25 mg (25 mg Oral Given 02/18/19 0210)  metoprolol tartrate (LOPRESSOR) tablet 100 mg (100 mg Oral Given 02/18/19 0209)  prochlorperazine (COMPAZINE) injection 10 mg (10 mg Intravenous Given 02/18/19 0209)  diphenhydrAMINE (BENADRYL) injection 12.5 mg (12.5 mg Intravenous Given 02/18/19 0209)    ED Course  I have reviewed the triage vital signs and the nursing notes.  Pertinent labs & imaging results that were available during my care of the patient were reviewed by me and considered in my medical decision making (see chart for details).  Clinical Course as of Feb 17 609  Fri Feb 18, 2019  0043 Hypertensive on arrival.  Patient with history of same and reports she is out of her medications.  BP(!): 180/100 [HM]  0043 Improved without intervention.  BP(!): 155/94 [HM]  0300 Blood pressure improved  BP: 139/90 [HM]    Clinical Course User Index [HM] Torry Adamczak, Gwenlyn Perking   MDM Rules/Calculators/A&P      Patient presents with headache and hypertension.  Reports headache is the same as previous when she was hypertensive.  She does arrive initially hypertensive however this is improving without intervention.  Home meds ordered.  Normal neurologic exam.  03:00 AM Patient reports she is feeling better.  Blood pressure has improved after home medications.  Patient will be discharged home with a prescription for hydrochlorothiazide.  She will need primary care follow-up.  CT scan of her head is without acute abnormalities.  No intracranial hemorrhage noted.  No evidence of hypertensive urgency.  She is well-appearing with normal neurologic  exam.  The patient was discussed with and seen by Dr. Leonette Monarch who agrees with the treatment plan.  Final Clinical Impression(s) / ED Diagnoses Final diagnoses:  Essential hypertension  Bad headache    Rx / DC Orders ED Discharge Orders         Ordered    hydrochlorothiazide (HYDRODIURIL) 25 MG tablet  Daily     02/18/19 0305  Samauri Kellenberger, Gwenlyn Perking 02/18/19 [id number]    Fatima Blank, MD 02/18/19 [phone]

## 2019-02-18 NOTE — Discharge Instructions (Addendum)
1. Medications: Hydrochlorothiazide, usual home medications 2. Treatment: rest, drink plenty of fluids,  3. Follow Up: Please followup with your primary doctor in 3 days for reevaluation of your high blood pressure; Please return to the ER for distantly high blood pressure, return of headache or other concerns.

## 2019-02-18 NOTE — ED Provider Notes (Signed)
Attestation: Medical screening examination/treatment/procedure(s) were conducted as a shared visit with non-physician practitioner(s) and myself.  I personally evaluated the patient during the encounter.  Briefly, the patient is a 53 y.o. female with h/o hypertension, here for right-sided headache similar to prior.   Vitals:   02/18/19 0230 02/18/19 0245  BP: (!) 159/92 (!) 146/80  Pulse: 62 (!) 56  Resp:    Temp:    SpO2: 99% 99%    CONSTITUTIONAL: Well-appearing, NAD NEURO:  Alert and oriented x 3, no focal deficits EYES:  pupils equal and reactive ENT/NECK:  trachea midline, no JVD CARDIO: Regular rate, regular rhythm, well-perfused PULM: None labored breathing GI/GU:  Abdomin non-distended MSK/SPINE:  No gross deformities, no edema SKIN:  no rash, atraumatic PSYCH:  Appropriate speech and behavior    EKG Interpretation  Date/Time:    Ventricular Rate:    PR Interval:    QRS Duration:   QT Interval:    QTC Calculation:   R Axis:     Text Interpretation:         Work up with negative CT head.  Treated with headache cocktail resulting in improved symptoms. The patient is safe for discharge with strict return precautions.           Fatima Blank, MD 02/18/19 [phone]

## 2019-02-18 NOTE — ED Notes (Signed)
Patient verbalizes understanding of discharge instructions. Opportunity for questioning and answers were provided. Armband removed by staff, pt discharged from ED. Ambulated out to lobby  

## 2019-03-09 NOTE — Progress Notes (Signed)
Patient ID: Taylor Cook, female   DOB: 01-13-1966, 53 y.o.   MRN: [id number]    Taylor Cook, is a 53 y.o. female  [id number]  [id number]  DOB - Apr 05, 1965  Subjective:  Chief Complaint and HPI: Taylor Cook is a 53 y.o. female here today for a follow up visit After being seenin the ED 02/17/2019 with HA and out of htn meds.  Given HA cocktail and a dose of home meds.  CT scan With no acute findings.  Back on meds.  Mild HA on and off but much better overall. Does not want flu shot.    From ED note: Taylor Cook is a 53 y.o. female with a hx of asthma, hypertension, COPD presents to the Emergency Department complaining of gradual, persistent, progressively worsening frontal and right sided headache onset 5pm while at work. Associated symptoms include HTN.  Pt reports she had her job check her BP when her headache did not resolve on its own and it was high.  She denies dizziness, syncope, weakness, numbness, vision changes, vomiting.  Pt reports her headache has improved from a 9/10 at its peak intensity to a 4/10 at this time. Pt denies aggravating or alleviating factors.  Pt reports that normally she gets a similar headache when her BP is high.  She reports she normally just goes to bed, but was concerned about the intensity of her pain and her significantly elevated BP.  Pt reports she has been out of her HTN medications for more than a week.     ED/Hospital notes reviewed.   Social History: Family history:  ROS:   Constitutional:  No f/c, No night sweats, No unexplained weight loss. EENT:  No vision changes, No blurry vision, No hearing changes. No mouth, throat, or ear problems.  Respiratory: No cough, No SOB Cardiac: No CP, no palpitations GI:  No abd pain, No N/V/D. GU: No Urinary s/sx Musculoskeletal: No joint pain Neuro: much less headache, no dizziness, no motor weakness.  Skin: No rash Endocrine:  No polydipsia. No polyuria.  Psych: Denies SI/HI  No problems  updated.  ALLERGIES: No Known Allergies  PAST MEDICAL HISTORY: Past Medical History:  Diagnosis Date  . Asthma   . Bronchitis   . Hypertension   . Seasonal allergies     MEDICATIONS AT HOME: Prior to Admission medications   Medication Sig Start Date End Date Taking? Authorizing Provider  albuterol (PROVENTIL HFA;VENTOLIN HFA) 108 (90 Base) MCG/ACT inhaler Inhale 2 puffs into the lungs every 6 (six) hours as needed for wheezing or shortness of breath. 02/23/18  Yes Fulp, Cammie, MD  cetirizine (ZYRTEC) 10 MG tablet Take 1 tablet (10 mg total) by mouth at bedtime. As needed for nasal congestion Patient taking differently: Take 10 mg by mouth at bedtime.  02/23/18  Yes Fulp, Cammie, MD  hydrochlorothiazide (HYDRODIURIL) 25 MG tablet Take 1 tablet (25 mg total) by mouth daily. 03/10/19  Yes Zerline Melchior M, PA-C  Diclofenac Sodium (PENNSAID) 2 % SOLN Apply 2 g topically 2 (two) times daily as needed (to affected area). Patient not taking: Reported on 02/18/2019 07/22/18   Leandrew Koyanagi, MD  ibuprofen (ADVIL,MOTRIN) 800 MG tablet Take 1 tablet (800 mg total) by mouth 3 (three) times daily. Patient not taking: Reported on 02/18/2019 04/24/16   Nona Dell, PA-C  metoprolol tartrate (LOPRESSOR) 100 MG tablet Take 1 tablet 2 hours prior to your Cardiac CT Patient not taking: Reported on 02/18/2019 09/05/18   Abigail Butts.,  PA-C  naproxen (NAPROSYN) 500 MG tablet Take 1 tablet (500 mg total) by mouth 2 (two) times daily. Patient not taking: Reported on 02/18/2019 06/18/18   Frederica Kuster, PA-C     Objective:  EXAM:   Vitals:   03/10/19 0854  BP: 131/84  Pulse: 71  Temp: 98.2 F (36.8 C)  TempSrc: Oral  SpO2: 97%  Weight: 160 lb (72.6 kg)  Height: 5' (1.524 m)    General appearance : A&OX3. NAD. Non-toxic-appearing HEENT: Atraumatic and Normocephalic.  PERRLA. EOM intact.   Chest/Lungs:  Breathing-non-labored, Good air entry bilaterally, breath sounds normal  without rales, rhonchi, or wheezing  CVS: S1 S2 regular, no murmurs, gallops, rubs  Neurology:  CN II-XII grossly intact, Non focal.   Psych:  TP linear. J/I WNL. Normal speech. Appropriate eye contact and affect.  Skin:  No Rash  Data Review No results found for: HGBA1C   Assessment & Plan   1. Hypertension, unspecified type Stable/controlled.  Continue current regimen - Comprehensive metabolic panel - hydrochlorothiazide (HYDRODIURIL) 25 MG tablet; Take 1 tablet (25 mg total) by mouth daily.  Dispense: 90 tablet; Refill: 1  2. Screening cholesterol level - Lipid panel  3. Encounter for examination following treatment at hospital Doing well  Patient have been counseled extensively about nutrition and exercise  Return in about 6 months (around 09/07/2019) for with PCP;  sooner if needed.  The patient was given clear instructions to go to ER or return to medical center if symptoms don't improve, worsen or new problems develop. The patient verbalized understanding. The patient was told to call to get lab results if they haven't heard anything in the next week.     Freeman Caldron, PA-C [facility] [place], [place]   03/10/2019, 9:15 AM

## 2019-03-10 ENCOUNTER — Ambulatory Visit: Payer: BLUE CROSS/BLUE SHIELD | Attending: Family Medicine | Admitting: Physician Assistant

## 2019-03-10 ENCOUNTER — Other Ambulatory Visit: Payer: Self-pay

## 2019-03-10 VITALS — BP 131/84 | HR 71 | Temp 98.2°F | Ht 60.0 in | Wt 160.0 lb

## 2019-03-10 DIAGNOSIS — Z09 Encounter for follow-up examination after completed treatment for conditions other than malignant neoplasm: Secondary | ICD-10-CM

## 2019-03-10 DIAGNOSIS — Z13220 Encounter for screening for lipoid disorders: Secondary | ICD-10-CM | POA: Diagnosis not present

## 2019-03-10 DIAGNOSIS — I10 Essential (primary) hypertension: Secondary | ICD-10-CM

## 2019-03-10 MED ORDER — HYDROCHLOROTHIAZIDE 25 MG PO TABS: 25.0000 mg | ORAL_TABLET | Freq: Every day | ORAL | 1 refills | Status: DC

## 2019-03-11 LAB — COMPREHENSIVE METABOLIC PANEL
ALT: 18 IU/L (ref 0–32)
AST: 25 IU/L (ref 0–40)
Albumin/Globulin Ratio: 1.4 (ref 1.2–2.2)
Albumin: 4.8 g/dL (ref 3.8–4.9)
Alkaline Phosphatase: 127 IU/L — ABNORMAL HIGH (ref 39–117)
BUN/Creatinine Ratio: 28 — ABNORMAL HIGH (ref 9–23)
BUN: 21 mg/dL (ref 6–24)
Bilirubin Total: 0.4 mg/dL (ref 0.0–1.2)
CO2: 25 mmol/L (ref 20–29)
Calcium: 10 mg/dL (ref 8.7–10.2)
Chloride: 99 mmol/L (ref 96–106)
Creatinine, Ser: 0.76 mg/dL (ref 0.57–1.00)
GFR calc Af Amer: 104 mL/min/{1.73_m2} (ref >59)
GFR calc non Af Amer: 90 mL/min/{1.73_m2} (ref >59)
Globulin, Total: 3.4 g/dL (ref 1.5–4.5)
Glucose: 102 mg/dL — ABNORMAL HIGH (ref 65–99)
Potassium: 4 mmol/L (ref 3.5–5.2)
Sodium: 139 mmol/L (ref 134–144)
Total Protein: 8.2 g/dL (ref 6.0–8.5)

## 2019-03-11 LAB — LIPID PANEL
Chol/HDL Ratio: 3.6 ratio (ref 0.0–4.4)
Cholesterol, Total: 251 mg/dL — ABNORMAL HIGH (ref 100–199)
HDL: 70 mg/dL (ref >39)
LDL Chol Calc (NIH): 160 mg/dL — ABNORMAL HIGH (ref 0–99)
Triglycerides: 118 mg/dL (ref 0–149)
VLDL Cholesterol Cal: 21 mg/dL (ref 5–40)

## 2019-03-15 ENCOUNTER — Other Ambulatory Visit: Payer: Self-pay | Admitting: Physician Assistant

## 2019-03-15 ENCOUNTER — Telehealth: Payer: Self-pay | Admitting: *Deleted

## 2019-03-15 MED ORDER — ATORVASTATIN CALCIUM 10 MG PO TABS: 10.0000 mg | ORAL_TABLET | Freq: Every day | ORAL | 3 refills | Status: DC

## 2019-03-15 NOTE — Telephone Encounter (Signed)
-----   Message from Argentina Donovan, [place] sent at 03/15/2019  6:58 AM EST ----- Please call patient.  Cholesterol is high and I sent her a prescription of lipitor to start for this.  Blood sugar, kidney function, electrolytes, and liver function are all normal.  Follow up as planned.  Thanks, Freeman Caldron, PA-C

## 2019-03-15 NOTE — Telephone Encounter (Signed)
MA UTR patient.

## 2019-03-18 ENCOUNTER — Telehealth: Payer: Self-pay | Admitting: *Deleted

## 2019-03-18 NOTE — Telephone Encounter (Signed)
MA UTR patient due to busy signal only

## 2019-06-28 ENCOUNTER — Other Ambulatory Visit: Payer: Self-pay

## 2019-06-28 ENCOUNTER — Emergency Department (HOSPITAL_COMMUNITY)
Admission: EM | Admit: 2019-06-28 | Discharge: 2019-06-28 | Disposition: A | Discharge status: Home / Self Care | Payer: BLUE CROSS/BLUE SHIELD | Attending: Emergency Medicine | Admitting: Emergency Medicine

## 2019-06-28 ENCOUNTER — Encounter (HOSPITAL_COMMUNITY): Payer: Self-pay | Admitting: Emergency Medicine

## 2019-06-28 DIAGNOSIS — M545 Low back pain: Secondary | ICD-10-CM | POA: Diagnosis present

## 2019-06-28 DIAGNOSIS — Z79899 Other long term (current) drug therapy: Secondary | ICD-10-CM | POA: Insufficient documentation

## 2019-06-28 DIAGNOSIS — M5441 Lumbago with sciatica, right side: Secondary | ICD-10-CM | POA: Diagnosis not present

## 2019-06-28 DIAGNOSIS — I10 Essential (primary) hypertension: Secondary | ICD-10-CM | POA: Insufficient documentation

## 2019-06-28 DIAGNOSIS — J449 Chronic obstructive pulmonary disease, unspecified: Secondary | ICD-10-CM | POA: Insufficient documentation

## 2019-06-28 DIAGNOSIS — Z87891 Personal history of nicotine dependence: Secondary | ICD-10-CM | POA: Diagnosis not present

## 2019-06-28 DIAGNOSIS — M62830 Muscle spasm of back: Secondary | ICD-10-CM | POA: Diagnosis not present

## 2019-06-28 LAB — URINALYSIS, ROUTINE W REFLEX MICROSCOPIC
Bilirubin Urine: NEGATIVE
Glucose, UA: NEGATIVE mg/dL
Hgb urine dipstick: NEGATIVE
Ketones, ur: NEGATIVE mg/dL
Leukocytes,Ua: NEGATIVE
Nitrite: NEGATIVE
Protein, ur: NEGATIVE mg/dL
Specific Gravity, Urine: 1.019 (ref 1.005–1.030)
pH: 6 (ref 5.0–8.0)

## 2019-06-28 MED ORDER — PREDNISONE 20 MG PO TABS: 40.0000 mg | ORAL_TABLET | Freq: Every day | ORAL | 0 refills | Status: AC

## 2019-06-28 MED ORDER — OXYCODONE HCL 5 MG PO TABS
5.0000 mg | ORAL_TABLET | Freq: Once | ORAL | Status: AC
  Administered 2019-06-28: 5 mg via ORAL
  Filled 2019-06-28: qty 1

## 2019-06-28 MED ORDER — DIAZEPAM 5 MG PO TABS
5.0000 mg | ORAL_TABLET | Freq: Once | ORAL | Status: AC
  Administered 2019-06-28: 5 mg via ORAL
  Filled 2019-06-28: qty 1

## 2019-06-28 MED ORDER — CYCLOBENZAPRINE HCL 10 MG PO TABS: 10.0000 mg | ORAL_TABLET | Freq: Two times a day (BID) | ORAL | 0 refills | Status: AC | PRN

## 2019-06-28 MED ORDER — KETOROLAC TROMETHAMINE 15 MG/ML IJ SOLN
15.0000 mg | Freq: Once | INTRAMUSCULAR | Status: AC
  Administered 2019-06-28: 15 mg via INTRAMUSCULAR
  Filled 2019-06-28: qty 1

## 2019-06-28 NOTE — ED Notes (Signed)
ED Provider at bedside. 

## 2019-06-28 NOTE — ED Triage Notes (Signed)
Pt endorses lower back pain that radiates up her back and around her right side and right leg. No relief with tylenol, heating pad or goody powder.

## 2019-06-28 NOTE — Discharge Instructions (Signed)
I have prescribed muscle relaxers for your pain, please do not drink or drive while taking this medications as they can make you drowsy.    I have also prescribed steroids, be advised this medication can cause insomnia, appetite changes. Please follow-up with PCP in 1 week for reevaluation of your symptoms.  You experience any bowel or bladder incontinence, fever, worsening in your symptoms please return to the ED.  

## 2019-06-28 NOTE — ED Notes (Signed)
Patient verbalizes understanding of discharge instructions. Opportunity for questioning and answers were provided. Armband removed by staff, pt discharged from ED to home with friend 

## 2019-06-28 NOTE — ED Provider Notes (Signed)
[facility] EMERGENCY DEPARTMENT Provider Note   CSN: [id number] Arrival date & time: 06/28/19  1007     History Chief Complaint  Patient presents with  . Back Pain    Taylor Cook is a 54 y.o. female.  54 y.o female with a PMH of Asthma, HTN, COPD presents to the ED with a chief complaint of lower back pain for the past 4 days.  Patient describes as a sharp sensation on the right lumbar spine with radiation down to her right leg.  The pain is exacerbated with ambulation, movement, palpation.  She has tried Tylenol, Goody powders, over-the-counter therapy without improvement in her symptoms.  No prior episode similar to this.  No prior history of IV drug use, fever, bowel or urinary complaints, prior history of cancer.  No dysuria, hematuria, urinary complaints.  Patient does ambulate at baseline without assistance.   The history is provided by the patient.       Past Medical History:  Diagnosis Date  . Asthma   . Bronchitis   . Hypertension   . Seasonal allergies     Patient Active Problem List   Diagnosis Date Noted  . Asthma 01/12/2014  . COPD (chronic obstructive pulmonary disease) ([place]) 01/12/2014  . Chest pain 01/12/2014  . Family history of premature CAD 01/12/2014  . Smoking 01/12/2014    Past Surgical History:  Procedure Laterality Date  . BREAST CYST EXCISION Left age 45  . BREAST SURGERY     removal of 2 cyst  . TUBAL LIGATION       OB History   No obstetric history on file.     Family History  Problem Relation Age of Onset  . Stroke Other        fx  . Heart attack Other        fx  . Heart disease Other        fx  . Hypertension Other        fx  . Cancer Other        fx    Social History   Tobacco Use  . Smoking status: Former Smoker    Types: Cigarettes    Quit date: 10/12/2017    Years since quitting: 1.7  . Smokeless tobacco: Never Used  Substance Use Topics  . Alcohol use: No    Alcohol/week: 0.0 standard  drinks  . Drug use: No    Home Medications Prior to Admission medications   Medication Sig Start Date End Date Taking? Authorizing Provider  albuterol (PROVENTIL HFA;VENTOLIN HFA) 108 (90 Base) MCG/ACT inhaler Inhale 2 puffs into the lungs every 6 (six) hours as needed for wheezing or shortness of breath. 02/23/18   Fulp, Cammie, MD  atorvastatin (LIPITOR) 10 MG tablet Take 1 tablet (10 mg total) by mouth daily. 03/15/19   Argentina Donovan, PA-C  cetirizine (ZYRTEC) 10 MG tablet Take 1 tablet (10 mg total) by mouth at bedtime. As needed for nasal congestion Patient taking differently: Take 10 mg by mouth at bedtime.  02/23/18   Fulp, Cammie, MD  cyclobenzaprine (FLEXERIL) 10 MG tablet Take 1 tablet (10 mg total) by mouth 2 (two) times daily as needed for up to 5 days for muscle spasms. 06/28/19 07/03/19  Janeece Fitting, PA-C  Diclofenac Sodium (PENNSAID) 2 % SOLN Apply 2 g topically 2 (two) times daily as needed (to affected area). Patient not taking: Reported on 02/18/2019 07/22/18   Leandrew Koyanagi, MD  hydrochlorothiazide (HYDRODIURIL)  25 MG tablet Take 1 tablet (25 mg total) by mouth daily. 03/10/19   Argentina Donovan, PA-C  ibuprofen (ADVIL,MOTRIN) 800 MG tablet Take 1 tablet (800 mg total) by mouth 3 (three) times daily. Patient not taking: Reported on 02/18/2019 04/24/16   Nona Dell, PA-C  metoprolol tartrate (LOPRESSOR) 100 MG tablet Take 1 tablet 2 hours prior to your Cardiac CT Patient not taking: Reported on 02/18/2019 09/05/18   Abigail Butts., PA-C  naproxen (NAPROSYN) 500 MG tablet Take 1 tablet (500 mg total) by mouth 2 (two) times daily. Patient not taking: Reported on 02/18/2019 06/18/18   Frederica Kuster, PA-C  predniSONE (DELTASONE) 20 MG tablet Take 2 tablets (40 mg total) by mouth daily for 5 days. 06/28/19 07/03/19  Janeece Fitting, PA-C    Allergies    Patient has no known allergies.  Review of Systems   Review of Systems  Constitutional: Negative for fever.    Musculoskeletal: Positive for back pain and myalgias.    Physical Exam Updated Vital Signs BP (!) 152/82 (BP Location: Right Arm)   Pulse 76   Temp 98.3 F (36.8 C) (Oral)   Resp 18   SpO2 98%   Physical Exam Vitals and nursing note reviewed.  Constitutional:      Appearance: Normal appearance.  HENT:     Head: Normocephalic and atraumatic.     Nose: Nose normal.     Mouth/Throat:     Mouth: Mucous membranes are moist.  Cardiovascular:     Rate and Rhythm: Normal rate.  Pulmonary:     Effort: Pulmonary effort is normal.     Breath sounds: No wheezing or rales.  Abdominal:     General: Abdomen is flat.     Tenderness: There is no abdominal tenderness. There is no right CVA tenderness or left CVA tenderness.  Musculoskeletal:     Cervical back: Normal range of motion and neck supple.     Lumbar back: Spasms and tenderness present. No swelling, deformity, lacerations or bony tenderness. Normal range of motion. No scoliosis.       Back:     Comments: RLE- KF,KE 5/5 strength LLE- HF, HE 5/5 strength Normal gait. No pronator drift. No leg drop.  Patellar reflexes present and symmetric. CN I, II and VIII not tested. CN II-XII grossly intact bilaterally.     Skin:    General: Skin is warm and dry.  Neurological:     Mental Status: She is alert and oriented to person, place, and time.     ED Results / Procedures / Treatments   Labs (all labs ordered are listed, but only abnormal results are displayed) Labs Reviewed  URINALYSIS, ROUTINE W REFLEX MICROSCOPIC - Abnormal; Notable for the following components:      Result Value   APPearance HAZY (*)    All other components within normal limits    EKG None  Radiology No results found.  Procedures Procedures (including critical care time)  Medications Ordered in ED Medications  diazepam (VALIUM) tablet 5 mg (5 mg Oral Given 06/28/19 1209)  ketorolac (TORADOL) 15 MG/ML injection 15 mg (15 mg Intramuscular Given  06/28/19 1209)  oxyCODONE (Oxy IR/ROXICODONE) immediate release tablet 5 mg (5 mg Oral Given 06/28/19 1209)    ED Course  I have reviewed the triage vital signs and the nursing notes.  Pertinent labs & imaging results that were available during my care of the patient were reviewed by me and considered in my  medical decision making (see chart for details).    MDM Rules/Calculators/A&P   Patient with no PMH presents to the ED with a chief complaint of right-sided low back pain which began 4 days ago.  Patient described as a sharp sensation from her right lower back with radiation to her right lower leg, exacerbated with ambulation.  Has tried over-the-counter methods without improvement in symptoms.  No prior history of cancer, bowel or bladder problems, fever, trauma.  She is currently employed at [employer], thus reports she does a good amount of lifting along with movement.  She has not had any urinary symptoms such as dysuria, hematuria, vaginal bleeding or discharge.  UA without any signs of infection such as nitrite, leukocytes.  Vitals are within normal limits.  Pain is localized and reproducible with palpation along the right lumbar spine, right hip.  She was provided with pain control while in the ED.  Suspicion for MSK involvement.  Provided with pain medication while in the ED, does report improvement in her symptoms.  Spoke to patient who was informed of her results, we discussed return precautions.  She is also going to be going home with a prescription for steroids, discussed risks and benefits of this medication.  She will also get muscle relaxers to help with her symptoms along with a work note.  Rice therapy was continue to encourage.  Vitals are within normal limits, patient stable for discharge. Return precautions discussed at length.     Portions of this note were generated with [employer]. Dictation errors may occur despite best attempts at proofreading.    Final Clinical Impression(s) / ED Diagnoses Final diagnoses:  Acute bilateral low back pain with right-sided sciatica    Rx / DC Orders ED Discharge Orders         Ordered    predniSONE (DELTASONE) 20 MG tablet  Daily     06/28/19 1249    cyclobenzaprine (FLEXERIL) 10 MG tablet  2 times daily PRN     06/28/19 1249           Janeece Fitting, PA-C 06/28/19 1251    Lajean Saver, MD 06/28/19 1515

## 2019-08-18 ENCOUNTER — Encounter (HOSPITAL_COMMUNITY): Payer: Self-pay | Admitting: Emergency Medicine

## 2019-08-18 ENCOUNTER — Other Ambulatory Visit: Payer: Self-pay

## 2019-08-18 ENCOUNTER — Ambulatory Visit (INDEPENDENT_AMBULATORY_CARE_PROVIDER_SITE_OTHER): Payer: BLUE CROSS/BLUE SHIELD

## 2019-08-18 ENCOUNTER — Ambulatory Visit (HOSPITAL_COMMUNITY)
Admission: EM | Admit: 2019-08-18 | Discharge: 2019-08-18 | Disposition: A | Discharge status: Home / Self Care | Payer: BLUE CROSS/BLUE SHIELD | Attending: Urgent Care | Admitting: Urgent Care

## 2019-08-18 DIAGNOSIS — W19XXXA Unspecified fall, initial encounter: Secondary | ICD-10-CM

## 2019-08-18 DIAGNOSIS — M25552 Pain in left hip: Secondary | ICD-10-CM

## 2019-08-18 DIAGNOSIS — S7002XA Contusion of left hip, initial encounter: Secondary | ICD-10-CM

## 2019-08-18 DIAGNOSIS — R109 Unspecified abdominal pain: Secondary | ICD-10-CM

## 2019-08-18 MED ORDER — MELOXICAM 7.5 MG PO TABS: 7.5000 mg | ORAL_TABLET | Freq: Every day | ORAL | 0 refills | Status: DC

## 2019-08-18 NOTE — ED Provider Notes (Signed)
[place]   MRN: [id number] DOB: Jun 12, 1965  Subjective:   Taylor Cook is a 54 y.o. female presenting for suffering an accidental fall yesterday.  Patient states that she was walking into her bathroom and tripped over her feet, landed on the left side of her body.  She has since had left hip pain, left sided body pain.  Denies head trauma, loss consciousness, confusion, dizziness, nausea, vomiting.  Denies hematuria.  Has been able to ambulate but with pain of the left hip and left flank side.  She did try Tylenol with some relief. Would like to make sure she did not suffer a fracture.   No current facility-administered medications for this encounter.  Current Outpatient Medications:  .  albuterol (PROVENTIL HFA;VENTOLIN HFA) 108 (90 Base) MCG/ACT inhaler, Inhale 2 puffs into the lungs every 6 (six) hours as needed for wheezing or shortness of breath., Disp: 1 Inhaler, Rfl: 2 .  cetirizine (ZYRTEC) 10 MG tablet, Take 1 tablet (10 mg total) by mouth at bedtime. As needed for nasal congestion (Patient taking differently: Take 10 mg by mouth at bedtime. ), Disp: 30 tablet, Rfl: 11 .  hydrochlorothiazide (HYDRODIURIL) 25 MG tablet, Take 1 tablet (25 mg total) by mouth daily., Disp: 90 tablet, Rfl: 1 .  atorvastatin (LIPITOR) 10 MG tablet, Take 1 tablet (10 mg total) by mouth daily., Disp: 90 tablet, Rfl: 3 .  Diclofenac Sodium (PENNSAID) 2 % SOLN, Apply 2 g topically 2 (two) times daily as needed (to affected area). (Patient not taking: Reported on 02/18/2019), Disp: 112 g, Rfl: 3 .  ibuprofen (ADVIL,MOTRIN) 800 MG tablet, Take 1 tablet (800 mg total) by mouth 3 (three) times daily. (Patient not taking: Reported on 02/18/2019), Disp: 21 tablet, Rfl: 0 .  metoprolol tartrate (LOPRESSOR) 100 MG tablet, Take 1 tablet 2 hours prior to your Cardiac CT (Patient not taking: Reported on 02/18/2019), Disp: 1 tablet, Rfl: 0 .  naproxen (NAPROSYN) 500 MG tablet, Take 1 tablet (500 mg total) by mouth  2 (two) times daily. (Patient not taking: Reported on 02/18/2019), Disp: 30 tablet, Rfl: 0   No Known Allergies  Past Medical History:  Diagnosis Date  . Asthma   . Bronchitis   . Hypertension   . Seasonal allergies      Past Surgical History:  Procedure Laterality Date  . BREAST CYST EXCISION Left age 34  . BREAST SURGERY     removal of 2 cyst  . TUBAL LIGATION      Family History  Problem Relation Age of Onset  . Stroke Other        fx  . Heart attack Other        fx  . Heart disease Other        fx  . Hypertension Other        fx  . Cancer Other        fx    Social History   Tobacco Use  . Smoking status: Former Smoker    Types: Cigarettes    Quit date: 10/12/2017    Years since quitting: 1.8  . Smokeless tobacco: Never Used  Vaping Use  . Vaping Use: Never used  Substance Use Topics  . Alcohol use: No    Alcohol/week: 0.0 standard drinks  . Drug use: No    ROS   Objective:   Vitals: BP (!) 144/89 (BP Location: Left Arm)   Pulse 82   Temp 98.3 F (36.8 C) (Oral)  Resp 16   LMP 06/04/2018 (Exact Date)   SpO2 100%   Physical Exam Constitutional:      General: She is not in acute distress.    Appearance: Normal appearance. She is well-developed. She is not ill-appearing, toxic-appearing or diaphoretic.  HENT:     Head: Normocephalic and atraumatic.     Comments: No evidence of trauma or ecchymosis.    Right Ear: External ear normal.     Left Ear: External ear normal.     Nose: Nose normal.     Mouth/Throat:     Mouth: Mucous membranes are moist.     Pharynx: Oropharynx is clear.  Eyes:     General: No scleral icterus.       Right eye: No discharge.        Left eye: No discharge.     Extraocular Movements: Extraocular movements intact.     Conjunctiva/sclera: Conjunctivae normal.     Pupils: Pupils are equal, round, and reactive to light.  Cardiovascular:     Rate and Rhythm: Normal rate.  Pulmonary:     Effort: Pulmonary effort is  normal.  Musculoskeletal:     Left hip: Tenderness (lateral hip) and bony tenderness present. No deformity, lacerations or crepitus. Normal range of motion. Normal strength.     Left knee: No swelling, deformity, effusion, erythema, ecchymosis, lacerations or bony tenderness. Normal range of motion. No tenderness.     Right lower leg: No edema.     Left lower leg: No edema.     Comments: Strength 5/5 throughout for upper and lower extremities.  Skin:    General: Skin is warm and dry.  Neurological:     General: No focal deficit present.     Mental Status: She is alert and oriented to person, place, and time.     Cranial Nerves: No cranial nerve deficit.     Motor: No weakness.     Coordination: Coordination abnormal (favoring left hip).     Gait: Gait normal.     Deep Tendon Reflexes: Reflexes normal.  Psychiatric:        Mood and Affect: Mood normal.        Behavior: Behavior normal.        Thought Content: Thought content normal.        Judgment: Judgment normal.    DG Hip Unilat W or Wo Pelvis 2-3 Views Left  Result Date: 08/18/2019 CLINICAL DATA:  Pain following fall EXAM: DG HIP   2-3V LEFT COMPARISON:  None. FINDINGS: Frontal and lateral views were obtained. No fracture or dislocation. Joint spaces appear normal. No erosive change. There is slight bony overgrowth along the superolateral left acetabulum. IMPRESSION: No fracture or dislocation. No appreciable joint space narrowing. Slight bony overgrowth along the superolateral aspect of the left acetabulum potentially places patient at increased risk for femoroacetabular impingement. Electronically Signed   By: Lowella Grip III M.D.   On: 08/18/2019 15:07    Assessment and Plan :   PDMP not reviewed this encounter.  1. Left hip pain   2. Left flank pain   3. Fall, initial encounter   4. Contusion of left hip, initial encounter     Will manage for hip contusion conservatively with rest, meloxicam.  Note for work  provided. Counseled patient on potential for adverse effects with medications prescribed/recommended today, ER and return-to-clinic precautions discussed, patient verbalized understanding.    Jaynee Eagles, [place] 08/18/19 1707

## 2019-08-18 NOTE — ED Triage Notes (Signed)
Patient presents to urgent care today after a fall yesterday.She reports she fell on cement on the bathroom floor. They have tried Tylenol with some relief of symptoms. Pt states her whole left side hurts but mainly hip and back pain.

## 2019-08-18 NOTE — Discharge Instructions (Signed)
Please just use Tylenol at a dose of 500mg -650mg  once every 6 hours as needed for your aches, pains, fevers. Do not use any nonsteroidal anti-inflammatories (NSAIDs) like ibuprofen, Motrin, naproxen, Aleve, etc. which are all available over-the-counter.    It is ok to use Tylenol and meloxicam together.

## 2019-08-22 ENCOUNTER — Telehealth: Payer: Self-pay

## 2019-08-22 ENCOUNTER — Encounter (HOSPITAL_COMMUNITY): Payer: Self-pay

## 2019-08-22 ENCOUNTER — Other Ambulatory Visit: Payer: Self-pay

## 2019-08-22 ENCOUNTER — Emergency Department (HOSPITAL_COMMUNITY)
Admission: EM | Admit: 2019-08-22 | Discharge: 2019-08-22 | Disposition: A | Discharge status: Home / Self Care | Payer: BLUE CROSS/BLUE SHIELD | Attending: Emergency Medicine | Admitting: Emergency Medicine

## 2019-08-22 DIAGNOSIS — J449 Chronic obstructive pulmonary disease, unspecified: Secondary | ICD-10-CM | POA: Diagnosis not present

## 2019-08-22 DIAGNOSIS — Z79899 Other long term (current) drug therapy: Secondary | ICD-10-CM | POA: Diagnosis not present

## 2019-08-22 DIAGNOSIS — I10 Essential (primary) hypertension: Secondary | ICD-10-CM | POA: Diagnosis not present

## 2019-08-22 DIAGNOSIS — W19XXXD Unspecified fall, subsequent encounter: Secondary | ICD-10-CM | POA: Insufficient documentation

## 2019-08-22 DIAGNOSIS — R109 Unspecified abdominal pain: Secondary | ICD-10-CM | POA: Insufficient documentation

## 2019-08-22 DIAGNOSIS — Z87891 Personal history of nicotine dependence: Secondary | ICD-10-CM | POA: Insufficient documentation

## 2019-08-22 LAB — URINALYSIS, ROUTINE W REFLEX MICROSCOPIC
Bilirubin Urine: NEGATIVE
Glucose, UA: NEGATIVE mg/dL
Hgb urine dipstick: NEGATIVE
Ketones, ur: NEGATIVE mg/dL
Leukocytes,Ua: NEGATIVE
Nitrite: NEGATIVE
Protein, ur: NEGATIVE mg/dL
Specific Gravity, Urine: 1.029 (ref 1.005–1.030)
pH: 5 (ref 5.0–8.0)

## 2019-08-22 MED ORDER — CYCLOBENZAPRINE HCL 10 MG PO TABS
10.0000 mg | ORAL_TABLET | Freq: Every day | ORAL | 0 refills | Status: DC
Start: 2019-08-22 — End: 2021-12-31

## 2019-08-22 NOTE — ED Triage Notes (Signed)
Pt arrives to ED w/ c/o flank pain radiating toward pelvis since a fall on 6/9. Pt rates pain 4/10. Pt denies urinary symptoms.

## 2019-08-22 NOTE — Discharge Instructions (Addendum)
Continue Tylenol as needed for pain Take Flexeril as needed for muscle pain and stiffness. This medicine can make you drowsy so do not take before driving or work Use ice/heat on the area You can use topical cream such as icy hot, ben gay, or aspercreme Please follow up with your doctor

## 2019-08-22 NOTE — ED Provider Notes (Signed)
[place] [facility] EMERGENCY DEPARTMENT Provider Note   CSN: [id number] Arrival date & time: 08/22/19  1420     History Chief Complaint  Patient presents with  . Flank Pain    Taylor Cook is a 54 y.o. female with history of asthma, HTN who presents with flank pain. She states that a couple days ago she had a fall in her bathroom.  She fell onto her left side and states that she was all twisted up under the sink.  She went to urgent care and had an x-ray of her left hip which was negative.  She also has some pain in her left arm and has intermittent tingling in the arm. She was prescribed meloxicam and states that the medicine is not helping her pain.  The pain radiates to her hip and to the front of her abdomen.  She denies back pain, numbness, weakness, urinary symptoms. She has been ambulatory. HPI     Past Medical History:  Diagnosis Date  . Asthma   . Bronchitis   . Hypertension   . Seasonal allergies     Patient Active Problem List   Diagnosis Date Noted  . Asthma 01/12/2014  . COPD (chronic obstructive pulmonary disease) ([place]) 01/12/2014  . Chest pain 01/12/2014  . Family history of premature CAD 01/12/2014  . Smoking 01/12/2014    Past Surgical History:  Procedure Laterality Date  . BREAST CYST EXCISION Left age 50  . BREAST SURGERY     removal of 2 cyst  . TUBAL LIGATION       OB History   No obstetric history on file.     Family History  Problem Relation Age of Onset  . Stroke Other        fx  . Heart attack Other        fx  . Heart disease Other        fx  . Hypertension Other        fx  . Cancer Other        fx    Social History   Tobacco Use  . Smoking status: Former Smoker    Types: Cigarettes    Quit date: 10/12/2017    Years since quitting: 1.8  . Smokeless tobacco: Never Used  Vaping Use  . Vaping Use: Never used  Substance Use Topics  . Alcohol use: No    Alcohol/week: 0.0 standard drinks  . Drug use: No    Home  Medications Prior to Admission medications   Medication Sig Start Date End Date Taking? Authorizing Provider  albuterol (PROVENTIL HFA;VENTOLIN HFA) 108 (90 Base) MCG/ACT inhaler Inhale 2 puffs into the lungs every 6 (six) hours as needed for wheezing or shortness of breath. 02/23/18   Fulp, Cammie, MD  atorvastatin (LIPITOR) 10 MG tablet Take 1 tablet (10 mg total) by mouth daily. 03/15/19   Argentina Donovan, PA-C  cetirizine (ZYRTEC) 10 MG tablet Take 1 tablet (10 mg total) by mouth at bedtime. As needed for nasal congestion Patient taking differently: Take 10 mg by mouth at bedtime.  02/23/18   Fulp, Cammie, MD  Diclofenac Sodium (PENNSAID) 2 % SOLN Apply 2 g topically 2 (two) times daily as needed (to affected area). Patient not taking: Reported on 02/18/2019 07/22/18   Leandrew Koyanagi, MD  hydrochlorothiazide (HYDRODIURIL) 25 MG tablet Take 1 tablet (25 mg total) by mouth daily. 03/10/19   Argentina Donovan, PA-C  ibuprofen (ADVIL,MOTRIN) 800 MG tablet Take  1 tablet (800 mg total) by mouth 3 (three) times daily. Patient not taking: Reported on 02/18/2019 04/24/16   Nona Dell, PA-C  meloxicam (MOBIC) 7.5 MG tablet Take 1 tablet (7.5 mg total) by mouth daily. 08/18/19   Jaynee Eagles, PA-C  metoprolol tartrate (LOPRESSOR) 100 MG tablet Take 1 tablet 2 hours prior to your Cardiac CT Patient not taking: Reported on 02/18/2019 09/05/18   Abigail Butts., PA-C  naproxen (NAPROSYN) 500 MG tablet Take 1 tablet (500 mg total) by mouth 2 (two) times daily. Patient not taking: Reported on 02/18/2019 06/18/18   Frederica Kuster, PA-C    Allergies    Patient has no known allergies.  Review of Systems   Review of Systems  Gastrointestinal: Positive for abdominal pain. Negative for nausea and vomiting.  Genitourinary: Positive for flank pain. Negative for dysuria and hematuria.  Musculoskeletal: Positive for arthralgias.    Physical Exam Updated Vital Signs BP (!) 179/101 (BP Location:  Right Arm)   Pulse 78   Temp 98.1 F (36.7 C) (Oral)   Resp 18   Ht 5\' 2"  (1.575 m)   Wt 70.3 kg   LMP 06/04/2018 (Exact Date)   SpO2 100%   BMI 28.35 kg/m   Physical Exam Vitals and nursing note reviewed.  Constitutional:      General: She is not in acute distress.    Appearance: Normal appearance. She is well-developed. She is not ill-appearing.  HENT:     Head: Normocephalic and atraumatic.  Eyes:     General: No scleral icterus.       Right eye: No discharge.        Left eye: No discharge.     Conjunctiva/sclera: Conjunctivae normal.     Pupils: Pupils are equal, round, and reactive to light.  Cardiovascular:     Rate and Rhythm: Normal rate and regular rhythm.  Pulmonary:     Effort: Pulmonary effort is normal. No respiratory distress.     Breath sounds: Normal breath sounds.  Abdominal:     General: There is no distension.     Palpations: Abdomen is soft.     Tenderness: There is no abdominal tenderness.  Musculoskeletal:     Cervical back: Normal range of motion.     Comments: No back tenderness  Mild-mod left CVA tenderness. No signs of traumatic injury    Skin:    General: Skin is warm and dry.  Neurological:     Mental Status: She is alert and oriented to person, place, and time.  Psychiatric:        Behavior: Behavior normal.     ED Results / Procedures / Treatments   Labs (all labs ordered are listed, but only abnormal results are displayed) Labs Reviewed  URINALYSIS, ROUTINE W REFLEX MICROSCOPIC    EKG None  Radiology No results found.  Procedures Procedures (including critical care time)  Medications Ordered in ED Medications - No data to display  ED Course  I have reviewed the triage vital signs and the nursing notes.  Pertinent labs & imaging results that were available during my care of the patient were reviewed by me and considered in my medical decision making (see chart for details).  54 year old female presents with left  flank pain after a fall several days ago.  There is no traumatic injury on exam.  She has no back, hip, abdominal tenderness.  She does have CVA tenderness but denies any urinary symptoms.  Very low suspicion  for any intra-abdominal hemorrhage or other pathology such as kidney stone or pyelonephritis. UA was checked and is normal.  Her main concern is pain control.  She is taking Tylenol meloxicam without relief.  We will add on muscle relaxer advised follow-up with her doctor.  Work note given.  MDM Rules/Calculators/A&P                           Final Clinical Impression(s) / ED Diagnoses Final diagnoses:  Left flank pain  Fall, subsequent encounter    Rx / DC Orders ED Discharge Orders    None       Recardo Evangelist, PA-C 08/22/19 [place], [place], DO 08/23/19 0023

## 2019-08-22 NOTE — Telephone Encounter (Signed)
Pt fell on 08/16/19 at home but went to urgent care 08/18/19 & was told tissue bruising left side on buttock area around to the front. Left hand sharp pain from landing on it Pt states she is no better & feeling worse. Please advise what to do. Next available time is 713/21.Call pt [phone].

## 2019-08-23 NOTE — Telephone Encounter (Signed)
Called pt stated she was seen at the urgent care yesterday/

## 2019-09-22 ENCOUNTER — Ambulatory Visit: Payer: BLUE CROSS/BLUE SHIELD | Admitting: Critical Care Medicine

## 2019-09-26 ENCOUNTER — Other Ambulatory Visit: Payer: Self-pay | Admitting: Family Medicine

## 2019-09-26 DIAGNOSIS — I10 Essential (primary) hypertension: Secondary | ICD-10-CM

## 2019-09-26 DIAGNOSIS — J309 Allergic rhinitis, unspecified: Secondary | ICD-10-CM

## 2019-09-26 MED ORDER — CETIRIZINE HCL 10 MG PO TABS: 10.0000 mg | ORAL_TABLET | Freq: Every day | ORAL | 1 refills | Status: DC

## 2019-09-26 MED ORDER — HYDROCHLOROTHIAZIDE 25 MG PO TABS: 25.0000 mg | ORAL_TABLET | Freq: Every day | ORAL | 0 refills | Status: DC

## 2019-09-26 NOTE — Telephone Encounter (Signed)
Copied from [place] [id number]. Topic: Quick Communication - Rx Refill/Question >> Sep 26, 2019  3:39 PM Mcneil, Ja-Kwan wrote: Medication: hydrochlorothiazide (HYDRODIURIL) 25 MG tablet and cetirizine (ZYRTEC) 10 MG tablet  Has the patient contacted their pharmacy? no  Preferred Pharmacy (with phone number or street name): [id number] - [place], [address] Phone: [phone]  Fax: [phone]  Agent: Please be advised that RX refills may take up to 3 business days. We ask that you follow-up with your pharmacy.

## 2019-09-26 NOTE — Telephone Encounter (Signed)
Attempted to call pt but unable to LM due to VM not set up- 30 day courtesy refill given Requested Prescriptions  Pending Prescriptions Disp Refills  . hydrochlorothiazide (HYDRODIURIL) 25 MG tablet 30 tablet 0    Sig: Take 1 tablet (25 mg total) by mouth daily.     Cardiovascular: Diuretics - Thiazide Failed - 09/26/2019  3:45 PM      Failed - Last BP in normal range    BP Readings from Last 1 Encounters:  08/22/19 (!) 167/96         Failed - Valid encounter within last 6 months    Recent Outpatient Visits          6 months ago Screening cholesterol level   [place], [place], [place]   1 year ago Essential hypertension   [place], [place], [place]   1 year ago Mild intermittent asthma without complication   [place], [place], [place]   1 year ago Hypertension, unspecified type   [place], [place], [place]             Passed - Ca in normal range and within 360 days    Calcium  Date Value Ref Range Status  03/10/2019 10.0 8.7 - 10.2 mg/dL Final   Calcium, Ion  Date Value Ref Range Status  06/30/2008 1.02 (L) 1.12 - 1.32 mmol/L Final         Passed - Cr in normal range and within 360 days    Creatinine, Ser  Date Value Ref Range Status  03/10/2019 0.76 0.57 - 1.00 mg/dL Final         Passed - K in normal range and within 360 days    Potassium  Date Value Ref Range Status  03/10/2019 4.0 3.5 - 5.2 mmol/L Final         Passed - Na in normal range and within 360 days    Sodium  Date Value Ref Range Status  03/10/2019 139 134 - 144 mmol/L Final         . cetirizine (ZYRTEC) 10 MG tablet 30 tablet 11    Sig: Take 1 tablet (10 mg total) by mouth at bedtime. As needed for nasal congestion     Ear, Nose, and Throat:  Antihistamines Passed - 09/26/2019  3:45 PM      Passed - Valid encounter within last 12 months    Recent  Outpatient Visits          6 months ago Screening cholesterol level   [place], [place], [place]   1 year ago Essential hypertension   [place], MD   1 year ago Mild intermittent asthma without complication   [place], MD   1 year ago Hypertension, unspecified type   [place], [place], [place]

## 2019-10-24 ENCOUNTER — Other Ambulatory Visit: Payer: Self-pay | Admitting: Family Medicine

## 2019-10-24 DIAGNOSIS — I10 Essential (primary) hypertension: Secondary | ICD-10-CM

## 2019-10-24 NOTE — Telephone Encounter (Signed)
Requested medication (s) are due for refill today: no  Requested medication (s) are on the active medication list: yes  Last refill:  09/26/2019  Future visit scheduled: no  Notes to clinic:  overdue for follow up Last appointment was canceled    Requested Prescriptions  Pending Prescriptions Disp Refills   hydrochlorothiazide (HYDRODIURIL) 25 MG tablet [Pharmacy Med Name: HYDROCHLOROTHIAZIDE 25MG  TABLETS] 30 tablet 0    Sig: TAKE 1 TABLET(25 MG) BY MOUTH DAILY      Cardiovascular: Diuretics - Thiazide Failed - 10/24/2019  3:32 AM      Failed - Last BP in normal range    BP Readings from Last 1 Encounters:  08/22/19 (!) 167/96          Failed - Valid encounter within last 6 months    Recent Outpatient Visits           7 months ago Screening cholesterol level   [place], [place], [place]   1 year ago Essential hypertension   [place], [place], [place]   1 year ago Mild intermittent asthma without complication   [place], [place], MD   1 year ago Hypertension, unspecified type   [place], [place], [place]              Passed - Ca in normal range and within 360 days    Calcium  Date Value Ref Range Status  03/10/2019 10.0 8.7 - 10.2 mg/dL Final   Calcium, Ion  Date Value Ref Range Status  06/30/2008 1.02 (L) 1.12 - 1.32 mmol/L Final          Passed - Cr in normal range and within 360 days    Creatinine, Ser  Date Value Ref Range Status  03/10/2019 0.76 0.57 - 1.00 mg/dL Final          Passed - K in normal range and within 360 days    Potassium  Date Value Ref Range Status  03/10/2019 4.0 3.5 - 5.2 mmol/L Final          Passed - Na in normal range and within 360 days    Sodium  Date Value Ref Range Status  03/10/2019 139 134 - 144 mmol/L Final

## 2019-11-30 ENCOUNTER — Other Ambulatory Visit: Payer: Self-pay

## 2019-11-30 ENCOUNTER — Emergency Department (HOSPITAL_COMMUNITY)
Admission: EM | Admit: 2019-11-30 | Discharge: 2019-12-01 | Disposition: A | Discharge status: Home / Self Care | Payer: BLUE CROSS/BLUE SHIELD | Attending: Emergency Medicine | Admitting: Emergency Medicine

## 2019-11-30 ENCOUNTER — Encounter (HOSPITAL_COMMUNITY): Payer: Self-pay | Admitting: Emergency Medicine

## 2019-11-30 DIAGNOSIS — R1110 Vomiting, unspecified: Secondary | ICD-10-CM | POA: Insufficient documentation

## 2019-11-30 DIAGNOSIS — Z5321 Procedure and treatment not carried out due to patient leaving prior to being seen by health care provider: Secondary | ICD-10-CM | POA: Insufficient documentation

## 2019-11-30 DIAGNOSIS — R197 Diarrhea, unspecified: Secondary | ICD-10-CM | POA: Insufficient documentation

## 2019-11-30 DIAGNOSIS — R109 Unspecified abdominal pain: Secondary | ICD-10-CM | POA: Insufficient documentation

## 2019-11-30 NOTE — ED Triage Notes (Signed)
Patient reports mid abdominal pain with emesis x2/diarrhea x2  this evening , denies fever or chills , respirations unlabored.

## 2019-12-01 ENCOUNTER — Emergency Department (HOSPITAL_COMMUNITY)
Admission: EM | Admit: 2019-12-01 | Discharge: 2019-12-01 | Disposition: A | Discharge status: Home / Self Care | Payer: Self-pay | Attending: Emergency Medicine | Admitting: Emergency Medicine

## 2019-12-01 ENCOUNTER — Emergency Department (HOSPITAL_COMMUNITY): Payer: Self-pay

## 2019-12-01 ENCOUNTER — Other Ambulatory Visit: Payer: Self-pay

## 2019-12-01 ENCOUNTER — Encounter (HOSPITAL_COMMUNITY): Payer: Self-pay | Admitting: Emergency Medicine

## 2019-12-01 DIAGNOSIS — R197 Diarrhea, unspecified: Secondary | ICD-10-CM | POA: Insufficient documentation

## 2019-12-01 DIAGNOSIS — R1012 Left upper quadrant pain: Secondary | ICD-10-CM | POA: Insufficient documentation

## 2019-12-01 DIAGNOSIS — R1013 Epigastric pain: Secondary | ICD-10-CM | POA: Insufficient documentation

## 2019-12-01 DIAGNOSIS — R590 Localized enlarged lymph nodes: Secondary | ICD-10-CM | POA: Insufficient documentation

## 2019-12-01 DIAGNOSIS — Z79899 Other long term (current) drug therapy: Secondary | ICD-10-CM | POA: Insufficient documentation

## 2019-12-01 DIAGNOSIS — J45909 Unspecified asthma, uncomplicated: Secondary | ICD-10-CM | POA: Insufficient documentation

## 2019-12-01 DIAGNOSIS — I10 Essential (primary) hypertension: Secondary | ICD-10-CM | POA: Insufficient documentation

## 2019-12-01 DIAGNOSIS — Z87891 Personal history of nicotine dependence: Secondary | ICD-10-CM | POA: Insufficient documentation

## 2019-12-01 DIAGNOSIS — R112 Nausea with vomiting, unspecified: Secondary | ICD-10-CM | POA: Insufficient documentation

## 2019-12-01 LAB — COMPREHENSIVE METABOLIC PANEL
ALT: 22 U/L (ref 0–44)
ALT: 22 U/L (ref 0–44)
AST: 26 U/L (ref 15–41)
AST: 27 U/L (ref 15–41)
Albumin: 4.7 g/dL (ref 3.5–5.0)
Albumin: 4.6 g/dL (ref 3.5–5.0)
Alkaline Phosphatase: 76 U/L (ref 38–126)
Alkaline Phosphatase: 60 U/L (ref 38–126)
Anion gap: 14 (ref 5–15)
Anion gap: 10 (ref 5–15)
BUN: 23 mg/dL — ABNORMAL HIGH (ref 6–20)
BUN: 20 mg/dL (ref 6–20)
CO2: 27 mmol/L (ref 22–32)
CO2: 29 mmol/L (ref 22–32)
Calcium: 9.9 mg/dL (ref 8.9–10.3)
Calcium: 9.7 mg/dL (ref 8.9–10.3)
Chloride: 98 mmol/L (ref 98–111)
Chloride: 101 mmol/L (ref 98–111)
Creatinine, Ser: 0.96 mg/dL (ref 0.44–1.00)
Creatinine, Ser: 0.91 mg/dL (ref 0.44–1.00)
GFR calc Af Amer: >60 mL/min (ref >60)
GFR calc Af Amer: >60 mL/min (ref >60)
GFR calc non Af Amer: >60 mL/min (ref >60)
GFR calc non Af Amer: >60 mL/min (ref >60)
Glucose, Bld: 95 mg/dL (ref 70–99)
Glucose, Bld: 109 mg/dL — ABNORMAL HIGH (ref 70–99)
Potassium: 3.5 mmol/L (ref 3.5–5.1)
Potassium: 3.6 mmol/L (ref 3.5–5.1)
Sodium: 139 mmol/L (ref 135–145)
Sodium: 140 mmol/L (ref 135–145)
Total Bilirubin: 0.7 mg/dL (ref 0.3–1.2)
Total Bilirubin: 1.1 mg/dL (ref 0.3–1.2)
Total Protein: 8.5 g/dL — ABNORMAL HIGH (ref 6.5–8.1)
Total Protein: 8.3 g/dL — ABNORMAL HIGH (ref 6.5–8.1)

## 2019-12-01 LAB — URINALYSIS, ROUTINE W REFLEX MICROSCOPIC
Glucose, UA: NEGATIVE mg/dL
Glucose, UA: NEGATIVE mg/dL
Hgb urine dipstick: NEGATIVE
Hgb urine dipstick: NEGATIVE
Ketones, ur: NEGATIVE mg/dL
Ketones, ur: NEGATIVE mg/dL
Leukocytes,Ua: NEGATIVE
Leukocytes,Ua: NEGATIVE
Nitrite: NEGATIVE
Nitrite: NEGATIVE
Protein, ur: 30 mg/dL — AB
Protein, ur: NEGATIVE mg/dL
Specific Gravity, Urine: 1.034 — ABNORMAL HIGH (ref 1.005–1.030)
Specific Gravity, Urine: 1.025 (ref 1.005–1.030)
pH: 5 (ref 5.0–8.0)
pH: 5.5 (ref 5.0–8.0)

## 2019-12-01 LAB — CBC
HCT: 41.6 % (ref 36.0–46.0)
HCT: 41.4 % (ref 36.0–46.0)
Hemoglobin: 13.9 g/dL (ref 12.0–15.0)
Hemoglobin: 13.6 g/dL (ref 12.0–15.0)
MCH: 30.9 pg (ref 26.0–34.0)
MCH: 30.5 pg (ref 26.0–34.0)
MCHC: 33.4 g/dL (ref 30.0–36.0)
MCHC: 32.9 g/dL (ref 30.0–36.0)
MCV: 92.4 fL (ref 80.0–100.0)
MCV: 92.8 fL (ref 80.0–100.0)
Platelets: 275 10*3/uL (ref 150–400)
Platelets: 269 10*3/uL (ref 150–400)
RBC: 4.5 MIL/uL (ref 3.87–5.11)
RBC: 4.46 MIL/uL (ref 3.87–5.11)
RDW: 12.7 % (ref 11.5–15.5)
RDW: 12.8 % (ref 11.5–15.5)
WBC: 4.7 10*3/uL (ref 4.0–10.5)
WBC: 4.2 10*3/uL (ref 4.0–10.5)
nRBC: 0 % (ref 0.0–0.2)
nRBC: 0 % (ref 0.0–0.2)

## 2019-12-01 LAB — LIPASE, BLOOD
Lipase: 30 U/L (ref 11–51)
Lipase: 23 U/L (ref 11–51)

## 2019-12-01 MED ORDER — SODIUM CHLORIDE 0.9 % IV BOLUS
1000.0000 mL | Freq: Once | INTRAVENOUS | Status: AC
  Administered 2019-12-01: 1000 mL via INTRAVENOUS

## 2019-12-01 MED ORDER — IOHEXOL 300 MG/ML  SOLN
80.0000 mL | Freq: Once | INTRAMUSCULAR | Status: AC | PRN
  Administered 2019-12-01: 80 mL via INTRAVENOUS

## 2019-12-01 MED ORDER — MORPHINE SULFATE (PF) 4 MG/ML IV SOLN
4.0000 mg | Freq: Once | INTRAVENOUS | Status: AC
  Administered 2019-12-01: 4 mg via INTRAVENOUS
  Filled 2019-12-01: qty 1

## 2019-12-01 MED ORDER — ONDANSETRON 4 MG PO TBDP
4.0000 mg | ORAL_TABLET | Freq: Three times a day (TID) | ORAL | 0 refills | Status: DC | PRN
Start: 2019-12-01 — End: 2021-12-31

## 2019-12-01 NOTE — ED Notes (Signed)
Patient transported to CT 

## 2019-12-01 NOTE — ED Notes (Signed)
Called for vitals x1 with no response

## 2019-12-01 NOTE — ED Provider Notes (Signed)
[place] EMERGENCY DEPARTMENT Provider Note   CSN: [id number] Arrival date & time: 12/01/19  1056     History Chief Complaint  Patient presents with  . Abdominal Pain    Taylor Cook is a 54 y.o. female with a past medical history of hypertension presenting to the ED with a chief complaint of abdominal pain, diarrhea and vomiting.  Last night states that she was having upper and left-sided abdominal pain that was sharp with associated nonbloody emesis and several episodes of diarrhea.  She denies any suspicious food intake.  She has taken several doses of Tylenol with only minimal improvement in her symptoms.  No sick contacts with similar symptoms.  No urinary complaints, chest pain, shortness of breath, fever or prior abdominal surgeries.  HPI     Past Medical History:  Diagnosis Date  . Asthma   . Bronchitis   . Hypertension   . Seasonal allergies     Patient Active Problem List   Diagnosis Date Noted  . Asthma 01/12/2014  . COPD (chronic obstructive pulmonary disease) ([place]) 01/12/2014  . Chest pain 01/12/2014  . Family history of premature CAD 01/12/2014  . Smoking 01/12/2014    Past Surgical History:  Procedure Laterality Date  . BREAST CYST EXCISION Left age 32  . BREAST SURGERY     removal of 2 cyst  . TUBAL LIGATION       OB History   No obstetric history on file.     Family History  Problem Relation Age of Onset  . Stroke Other        fx  . Heart attack Other        fx  . Heart disease Other        fx  . Hypertension Other        fx  . Cancer Other        fx    Social History   Tobacco Use  . Smoking status: Former Smoker    Types: Cigarettes    Quit date: 10/12/2017    Years since quitting: 2.1  . Smokeless tobacco: Never Used  Vaping Use  . Vaping Use: Never used  Substance Use Topics  . Alcohol use: No    Alcohol/week: 0.0 standard drinks  . Drug use: No    Home Medications Prior to Admission medications     Medication Sig Start Date End Date Taking? Authorizing Provider  albuterol (PROVENTIL HFA;VENTOLIN HFA) 108 (90 Base) MCG/ACT inhaler Inhale 2 puffs into the lungs every 6 (six) hours as needed for wheezing or shortness of breath. 02/23/18   Fulp, Cammie, MD  atorvastatin (LIPITOR) 10 MG tablet Take 1 tablet (10 mg total) by mouth daily. 03/15/19   Argentina Donovan, PA-C  cetirizine (ZYRTEC) 10 MG tablet Take 1 tablet (10 mg total) by mouth at bedtime. As needed for nasal congestion 09/26/19   Fulp, Cammie, MD  cyclobenzaprine (FLEXERIL) 10 MG tablet Take 1 tablet (10 mg total) by mouth at bedtime. 08/22/19   Recardo Evangelist, PA-C  hydrochlorothiazide (HYDRODIURIL) 25 MG tablet Take 1 tablet (25 mg total) by mouth daily. 09/26/19   Fulp, Cammie, MD  meloxicam (MOBIC) 7.5 MG tablet Take 1 tablet (7.5 mg total) by mouth daily. 08/18/19   Jaynee Eagles, PA-C  ondansetron (ZOFRAN ODT) 4 MG disintegrating tablet Take 1 tablet (4 mg total) by mouth every 8 (eight) hours as needed for nausea or vomiting. 12/01/19   Jaryn Hocutt, PA-C  metoprolol  tartrate (LOPRESSOR) 100 MG tablet Take 1 tablet 2 hours prior to your Cardiac CT Patient not taking: Reported on 02/18/2019 09/05/18 08/22/19  Abigail Butts., PA-C    Allergies    Patient has no known allergies.  Review of Systems   Review of Systems  Constitutional: Negative for appetite change, chills and fever.  HENT: Negative for ear pain, rhinorrhea, sneezing and sore throat.   Eyes: Negative for photophobia and visual disturbance.  Respiratory: Negative for cough, chest tightness, shortness of breath and wheezing.   Cardiovascular: Negative for chest pain and palpitations.  Gastrointestinal: Positive for abdominal pain, diarrhea, nausea and vomiting. Negative for blood in stool and constipation.  Genitourinary: Negative for dysuria, hematuria and urgency.  Musculoskeletal: Negative for myalgias.  Skin: Negative for rash.  Neurological: Negative for  dizziness, weakness and light-headedness.    Physical Exam Updated Vital Signs BP 122/63   Pulse 71   Temp 98.2 F (36.8 C) (Oral)   Resp 18   Ht 5\' 1"  (1.549 m)   Wt 73 kg   LMP 06/04/2018 (Exact Date)   SpO2 98%   BMI 30.42 kg/m   Physical Exam Vitals and nursing note reviewed.  Constitutional:      General: She is not in acute distress.    Appearance: She is well-developed.  HENT:     Head: Normocephalic and atraumatic.     Nose: Nose normal.  Eyes:     General: No scleral icterus.       Left eye: No discharge.     Conjunctiva/sclera: Conjunctivae normal.  Cardiovascular:     Rate and Rhythm: Normal rate and regular rhythm.     Heart sounds: Normal heart sounds. No murmur heard.  No friction rub. No gallop.   Pulmonary:     Effort: Pulmonary effort is normal. No respiratory distress.     Breath sounds: Normal breath sounds.  Abdominal:     General: Bowel sounds are normal. There is no distension.     Palpations: Abdomen is soft.     Tenderness: There is abdominal tenderness in the epigastric area and left upper quadrant. There is no guarding.  Musculoskeletal:        General: Normal range of motion.     Cervical back: Normal range of motion and neck supple.  Skin:    General: Skin is warm and dry.     Findings: No rash.  Neurological:     Mental Status: She is alert.     Motor: No abnormal muscle tone.     Coordination: Coordination normal.     ED Results / Procedures / Treatments   Labs (all labs ordered are listed, but only abnormal results are displayed) Labs Reviewed  COMPREHENSIVE METABOLIC PANEL - Abnormal; Notable for the following components:      Result Value   Glucose, Bld 109 (*)    Total Protein 8.3 (*)    All other components within normal limits  URINALYSIS, ROUTINE W REFLEX MICROSCOPIC - Abnormal; Notable for the following components:   APPearance CLOUDY (*)    Bilirubin Urine SMALL (*)    All other components within normal limits    LIPASE, BLOOD  CBC    EKG None  Radiology CT ABDOMEN PELVIS W CONTRAST  Result Date: 12/01/2019 CLINICAL DATA:  LEFT upper quadrant pain. EXAM: CT ABDOMEN AND PELVIS WITH CONTRAST TECHNIQUE: Multidetector CT imaging of the abdomen and pelvis was performed using the standard protocol following bolus administration of intravenous contrast.  CONTRAST:  32mL OMNIPAQUE IOHEXOL 300 MG/ML  SOLN COMPARISON:  March 25, 2016 FINDINGS: Lower chest: RIGHT infrahilar lymph node measuring approximately 11 mm. Finding is similar when compared to the study from January 2016. Chest incompletely imaged. No consolidation. No pleural effusion. Subtle ground-glass along the minor fissure in the RIGHT chest unchanged since 2016, incompletely imaged. Small nodular area along the major fissure in the LEFT chest likely a subpleural lymph node is also unchanged. Hepatobiliary: Liver is unremarkable. No pericholecystic stranding or biliary duct dilation. Pancreas: Pancreas without ductal dilation or signs of inflammation. Spleen: Spleen normal in size and contour. Adrenals/Urinary Tract: Adrenal glands are normal. Symmetric renal enhancement. No hydronephrosis. Small low-density lesion arising from the lower pole the LEFT kidney compatible with cyst not changed since 2018. Stomach/Bowel: The appendix is normal. No acute gastrointestinal process. Stool stomach under distended limiting assessment. Vascular/Lymphatic: Normal caliber abdominal aorta. There is no gastrohepatic or hepatoduodenal ligament lymphadenopathy. No retroperitoneal or mesenteric lymphadenopathy. No pelvic sidewall lymphadenopathy. Reproductive: Uterus and adnexa with changes of tubal ligation on the LEFT. No adnexal mass. CT appearance of uterus and adnexa unremarkable. Other: Small fat containing umbilical hernia. Musculoskeletal: No acute musculoskeletal process. Spinal degenerative changes. IMPRESSION: 1. No acute findings in the abdomen or pelvis. 2. Small  fat containing umbilical hernia. 3. Mildly enlarged RIGHT infrahilar lymph node in this patient shown to have adenopathy in the chest as far back as 2016. Differential considerations would again include sarcoidosis and lymphoproliferative disorder or metastatic disease though may have been previously assessed. Correlate with patient history. Electronically Signed   By: Zetta Bills M.D.   On: 12/01/2019 16:28    Procedures Procedures (including critical care time)  Medications Ordered in ED Medications  iohexol (OMNIPAQUE) 300 MG/ML solution 80 mL (80 mLs Intravenous Contrast Given 12/01/19 1609)  sodium chloride 0.9 % bolus 1,000 mL (1,000 mLs Intravenous New Bag/Given 12/01/19 1642)  morphine 4 MG/ML injection 4 mg (4 mg Intravenous Given 12/01/19 1642)    ED Course  I have reviewed the triage vital signs and the nursing notes.  Pertinent labs & imaging results that were available during my care of the patient were reviewed by me and considered in my medical decision making (see chart for details).    MDM Rules/Calculators/A&P                          54 year old female with a past medical history of hypertension presenting to the ED with a chief complaint of abdominal pain, diarrhea and vomiting since last night.  Reports upper and left-sided abdominal pain that was sharp.  No sick contacts with similar symptoms, urinary symptoms, fever cough.  On exam there is some tenderness at the upper and left-sided abdomen without rebound or guarding.  She is afebrile without recent use of antipyretics.  Lab work including CBC, CMP, lipase and urinalysis was unremarkable.  CT without any acute findings or structural cause of symptoms.  She does have right-sided infrahilar lymph node enlargement since 2016.  Patient was given IV fluids and pain medication here with improvement in her symptoms.  Suspect that her symptoms are viral in nature.  We will treat symptomatically and have her f/u with PCP. Return  precautions given.   Patient is hemodynamically stable, in NAD, and able to ambulate in the ED. Evaluation does not show pathology that would require ongoing emergent intervention or inpatient treatment. I explained the diagnosis to the patient. Pain has  been managed and has no complaints prior to discharge. Patient is comfortable with above plan and is stable for discharge at this time. All questions were answered prior to disposition. Strict return precautions for returning to the ED were discussed. Encouraged follow up with PCP.   An After Visit Summary was printed and given to the patient.   Portions of this note were generated with [employer]. Dictation errors may occur despite best attempts at proofreading.  Final Clinical Impression(s) / ED Diagnoses Final diagnoses:  Nausea vomiting and diarrhea  Lymphadenopathy, hilar    Rx / DC Orders ED Discharge Orders         Ordered    ondansetron (ZOFRAN ODT) 4 MG disintegrating tablet  Every 8 hours PRN        12/01/19 1707           Delia Heady, PA-C 12/01/19 1721    Quintella Reichert, MD 12/03/19 1208

## 2019-12-01 NOTE — ED Triage Notes (Signed)
Pt states she got out of shower yesterday and began feeling hot with LUQ pain, vomiting, and diarrhea.  States vomiting and diarrhea subsided and only has pain at present.

## 2019-12-01 NOTE — Discharge Instructions (Addendum)
Take the medication as needed to help with your nausea. Make sure you are drinking plenty of fluids to prevent dehydration. Follow-up with your primary care provider. Your CT scan did not show any structural or infectious cause of your symptoms today.  There was an enlarged right sided lymph node in your chest, you have had similar lymph node enlargement since 2016.  You will need to tell your primary care provider about this. Return to the ER if you start to experience worsening abdominal pain, fever, chest pain, shortness of breath.

## 2020-01-09 ENCOUNTER — Telehealth: Payer: Medicaid Other | Admitting: Family Medicine

## 2020-01-11 ENCOUNTER — Ambulatory Visit: Payer: Medicaid Other | Admitting: Family Medicine

## 2020-02-15 IMAGING — CT CT HEAR MORPH WITH CTA COR WITH SCORE WITH CA WITH CONTRAST AND
4 of 7 series · 8 of 20 positions shown, 9 images · IV contrast (omnipaque)
Comparison: 03/13/2014
COMPARISON: 03/13/2014

Addendum:
EXAM:
OVER-READ INTERPRETATION  CT CHEST

The following report is an over-read performed by radiologist Dr.
over-read does not include interpretation of cardiac or coronary
anatomy or pathology. The coronary calcium score/coronary CTA
interpretation by the cardiologist is attached.
HISTORY: chest pain
Cardiac/Coronary  CT
TECHNIQUE: The patient was scanned on a Siemens Force scanner.
PROTOCOL: A 120 kV prospective scan was triggered in the descending thoracic
aorta at 111 HU's. Axial non-contrast 3 mm slices were carried out
through the heart. The data set was analyzed on a dedicated work
station and scored using the Agatson method. Gantry rotation speed
was 250 msecs and collimation was .6 mm. Beta blockade and 0.8 mg of
sl NTG was given. The 3D data set was reconstructed in 5% intervals
of the 67-82 % of the R-R cycle. Diastolic phases were analyzed on a
dedicated work station using MPR, MIP and VRT modes. The patient
received 80mL OMNIPAQUE IOHEXOL 350 MG/ML SOLN of contrast.

[Series 6: best diast 76 % · axial · 0.32mm/px · z∈[+1294,+1332]mm · 2 of 293 slices shown]
[im 98/293  vessel]
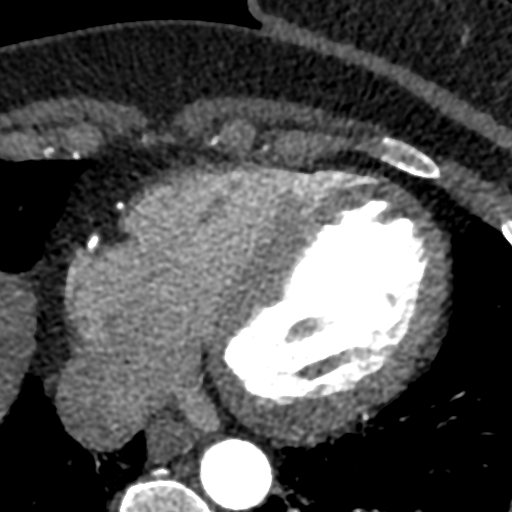
[im 195/293  vessel]
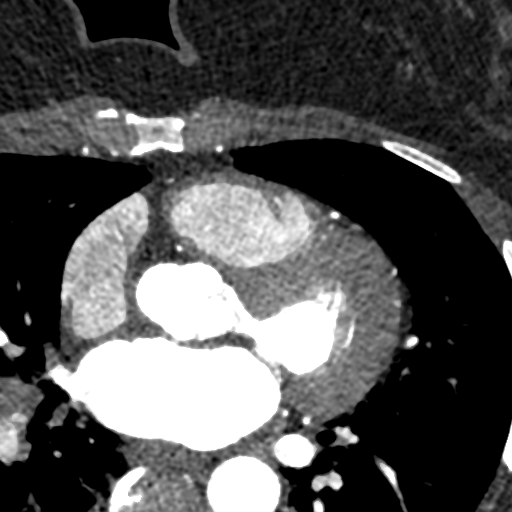

[Series 7: (id) · axial · 0.32mm/px · z∈[+1294,+1332]mm · 2 of 293 slices shown, 3 images]
[im 98/293  vessel]
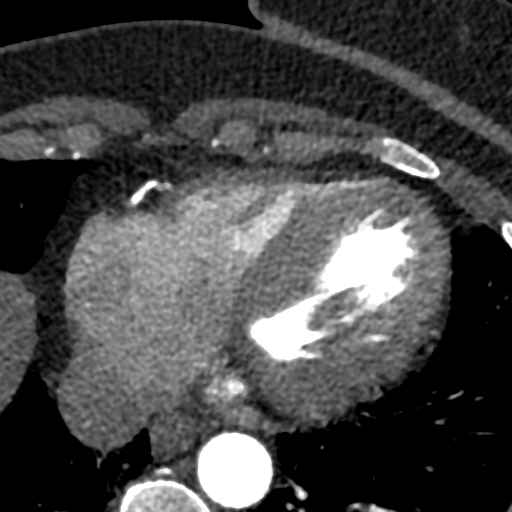
[im 98/293  lung]
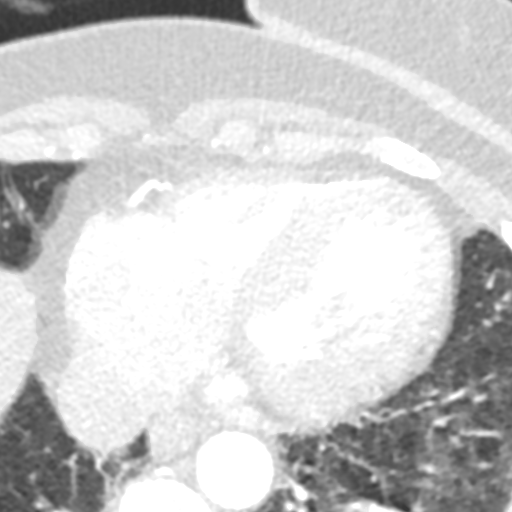
[im 195/293  vessel]
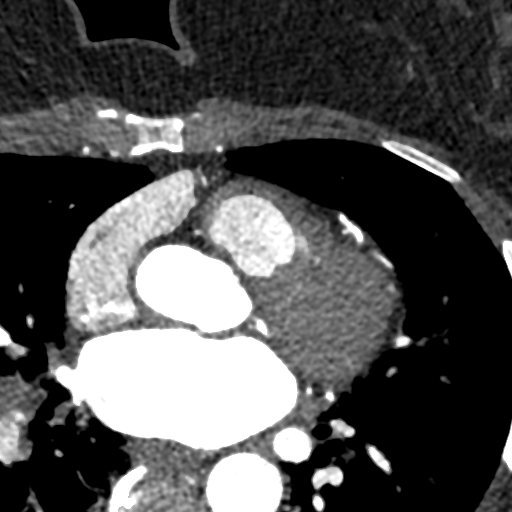

[Series 8: ts diast sharp 76 % · axial · 0.32mm/px · z∈[+1294,+1332]mm · 2 of 293 slices shown]
[im 98/293  lung]
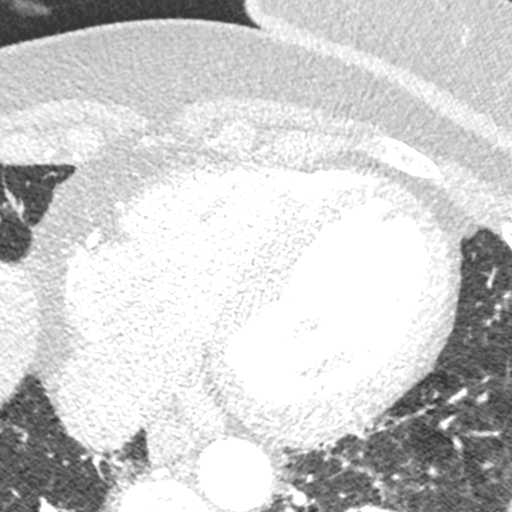
[im 195/293  lung]
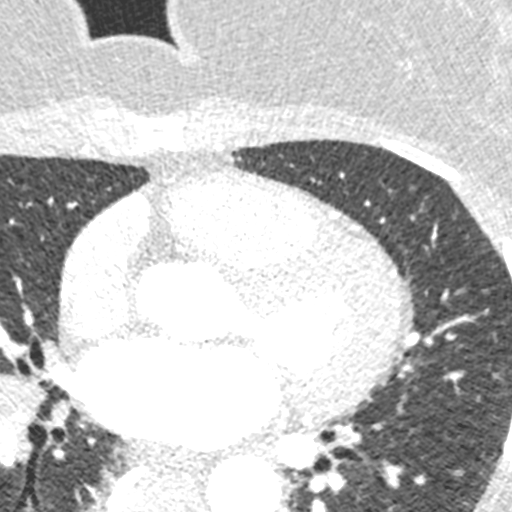

[Series 9: ts syst sharp · axial · 0.32mm/px · z∈[+1294,+1332]mm · 2 of 293 slices shown]
[im 98/293  lung]
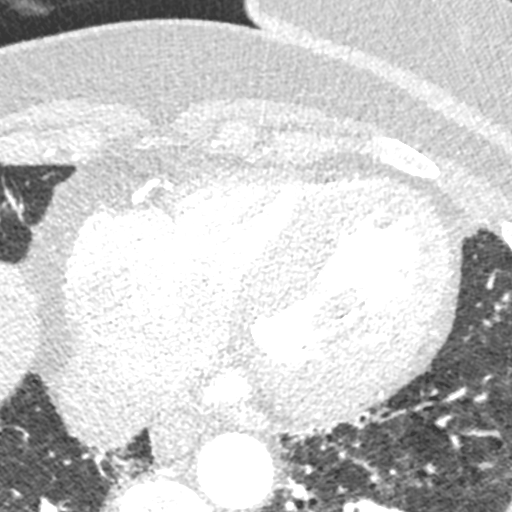
[im 195/293  lung]
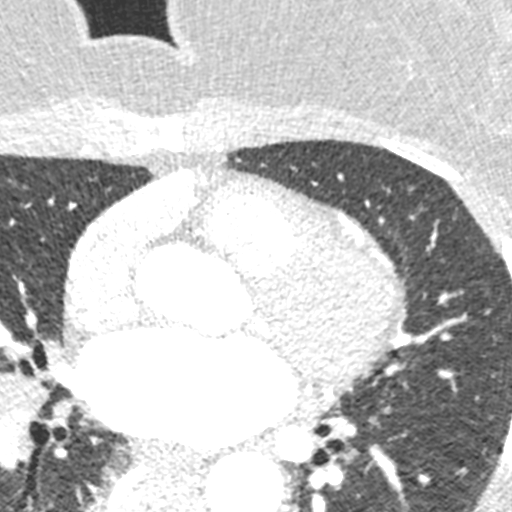

[8 of 20 positions shown; findings below may reference images not displayed]

FINDINGS: Vascular: Cardiac enlargement.  No pericardial effusion.

Mediastinum/Nodes: Enlarged mediastinal and bilateral hilar lymph
nodes. Similar to 03/13/2014. Index subcarinal lymph node is stable
measuring 2.1 cm. Right hilar lymph node measures 1.6 cm, image
[DATE]. Previously 1.8 cm. Left hilar lymph node measures 1.3 cm,
image [DATE]. Previously this measured the same.

Lungs/Pleura: No pleural effusion. Scar identified within the right
middle lobe. Unchanged perifissural nodule within the oblique
fissure measuring 4 mm. Right middle lobe perifissural nodule is
unchanged measuring 4 mm.

Upper Abdomen: No acute abnormality.

Musculoskeletal: No chest wall mass or suspicious bone lesions
identified.
IMPRESSION: 1. Mediastinal and hilar adenopathy is similar to previous exam.
Although nonspecific findings are favored to represent sarcoid.
2. Stable small perifissural nodules. No suspicious nodule or mass
identified.
3. Cardiac enlargement.
FINDINGS: Coronary calcium score: The patient's coronary artery calcium score
is 0.

Coronary arteries: Normal coronary origins.  Right dominance.

Moderately tortuous coronary arteries.  No myocardial bridging seen.

Right Coronary Artery: No detectable plaque or stenosis.

Left Main Coronary Artery: No detectable plaque or stenosis.

Left Anterior Descending Coronary Artery: No detectable plaque or
stenosis.

Left Circumflex Artery: No detectable plaque or stenosis.

Aorta: Normal size, 27 mm at the mid ascending aorta (level of the
PA bifurcation) measured double oblique. No calcifications. No
dissection.

Aortic Valve: No calcifications.

Other findings:

Normal pulmonary vein drainage into the left atrium.

Normal left atrial appendage without a thrombus.

Normal size of the pulmonary artery.

Patent foramen ovale with left to right shunt.

LV end diastolic dimension: 51 mm

LV end systolic dimension: 33 mm

Grossly normal LV and RV systolic function.

Grossly normal LV wall thickness

Prominent papillary muscles.
IMPRESSION: 1. No evidence of CAD, CADRADS = 0.

2. Coronary calcium score of 0. This was 0 percentile for age and
sex matched control.

3. Normal coronary origin with right dominance.

4. Patent foramen ovale with left to right shunt.

*** End of Addendum ***
EXAM:
OVER-READ INTERPRETATION  CT CHEST

The following report is an over-read performed by radiologist Dr.
over-read does not include interpretation of cardiac or coronary
anatomy or pathology. The coronary calcium score/coronary CTA
interpretation by the cardiologist is attached.
FINDINGS: Vascular: Cardiac enlargement.  No pericardial effusion.

Mediastinum/Nodes: Enlarged mediastinal and bilateral hilar lymph
nodes. Similar to 03/13/2014. Index subcarinal lymph node is stable
measuring 2.1 cm. Right hilar lymph node measures 1.6 cm, image
[DATE]. Previously 1.8 cm. Left hilar lymph node measures 1.3 cm,
image [DATE]. Previously this measured the same.

Lungs/Pleura: No pleural effusion. Scar identified within the right
middle lobe. Unchanged perifissural nodule within the oblique
fissure measuring 4 mm. Right middle lobe perifissural nodule is
unchanged measuring 4 mm.

Upper Abdomen: No acute abnormality.

Musculoskeletal: No chest wall mass or suspicious bone lesions
identified.
IMPRESSION: 1. Mediastinal and hilar adenopathy is similar to previous exam.
Although nonspecific findings are favored to represent sarcoid.
2. Stable small perifissural nodules. No suspicious nodule or mass
identified.
3. Cardiac enlargement.

## 2020-03-20 ENCOUNTER — Emergency Department (HOSPITAL_COMMUNITY)
Admission: EM | Admit: 2020-03-20 | Discharge: 2020-03-20 | Disposition: A | Discharge status: Home / Self Care | Payer: Medicaid Other | Attending: Emergency Medicine | Admitting: Emergency Medicine

## 2020-03-20 ENCOUNTER — Other Ambulatory Visit: Payer: Self-pay

## 2020-03-20 DIAGNOSIS — M25512 Pain in left shoulder: Secondary | ICD-10-CM

## 2020-03-20 DIAGNOSIS — Z87891 Personal history of nicotine dependence: Secondary | ICD-10-CM | POA: Insufficient documentation

## 2020-03-20 DIAGNOSIS — I10 Essential (primary) hypertension: Secondary | ICD-10-CM | POA: Insufficient documentation

## 2020-03-20 DIAGNOSIS — Z79899 Other long term (current) drug therapy: Secondary | ICD-10-CM | POA: Insufficient documentation

## 2020-03-20 DIAGNOSIS — J45909 Unspecified asthma, uncomplicated: Secondary | ICD-10-CM | POA: Insufficient documentation

## 2020-03-20 DIAGNOSIS — J449 Chronic obstructive pulmonary disease, unspecified: Secondary | ICD-10-CM | POA: Insufficient documentation

## 2020-03-20 MED ORDER — METHOCARBAMOL 500 MG PO TABS
500.0000 mg | ORAL_TABLET | Freq: Every evening | ORAL | 0 refills | Status: DC | PRN
Start: 2020-03-20 — End: 2021-12-31

## 2020-03-20 MED ORDER — NAPROXEN 500 MG PO TABS
500.0000 mg | ORAL_TABLET | Freq: Two times a day (BID) | ORAL | 0 refills | Status: DC
Start: 2020-03-20 — End: 2020-10-10

## 2020-03-20 NOTE — ED Triage Notes (Signed)
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 Denies chest pain, denies injury. Pain is intermittent, worse with movement.

## 2020-03-20 NOTE — ED Provider Notes (Signed)
Taylor Cook Provider Note   CSN: [id number] Arrival date & time: 03/20/20  8295     History Chief Complaint  Patient presents with  . Shoulder Pain    Taylor Cook is a 55 y.o. female presenting for evaluation of left shoulder pain.  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  Pain is constant, worse with movement and palpation.  It is isolated to the left shoulder, does not radiate.  No numbness or tingling.  Denies fall, trauma, or injury.  She does state earlier that night she had a misstep, but did not catch herself or falling.  She does have a history of knee problems, follows with orthopedics.  She denies fevers, chills, chest pain, shortness of breath, cough.  She has not taken anything for her symptoms.  HPI     Past Medical History:  Diagnosis Date  . Asthma   . Bronchitis   . Hypertension   . Seasonal allergies     Patient Active Problem List   Diagnosis Date Noted  . Asthma 01/12/2014  . COPD (chronic obstructive pulmonary disease) ([place]) 01/12/2014  . Chest pain 01/12/2014  . Family history of premature CAD 01/12/2014  . Smoking 01/12/2014    Past Surgical History:  Procedure Laterality Date  . BREAST CYST EXCISION Left age 61  . BREAST SURGERY     removal of 2 cyst  . TUBAL LIGATION       OB History   No obstetric history on file.     Family History  Problem Relation Age of Onset  . Stroke Other        fx  . Heart attack Other        fx  . Heart disease Other        fx  . Hypertension Other        fx  . Cancer Other        fx    Social History   Tobacco Use  . Smoking status: Former Smoker    Types: Cigarettes    Quit date: 10/12/2017    Years since quitting: 2.4  . Smokeless tobacco: Never Used  Vaping Use  . Vaping Use: Never used  Substance Use Topics  . Alcohol use: No    Alcohol/week: 0.0  standard drinks  . Drug use: No    Home Medications Prior to Admission medications   Medication Sig Start Date End Date Taking? Authorizing Provider  methocarbamol (ROBAXIN) 500 MG tablet Take 1 tablet (500 mg total) by mouth at bedtime as needed for muscle spasms. 03/20/20  Yes Sharanya Templin, PA-C  naproxen (NAPROSYN) 500 MG tablet Take 1 tablet (500 mg total) by mouth 2 (two) times daily with a meal. 03/20/20  Yes Marielena Harvell, PA-C  albuterol (PROVENTIL HFA;VENTOLIN HFA) 108 (90 Base) MCG/ACT inhaler Inhale 2 puffs into the lungs every 6 (six) hours as needed for wheezing or shortness of breath. 02/23/18   Fulp, Cammie, MD  atorvastatin (LIPITOR) 10 MG tablet Take 1 tablet (10 mg total) by mouth daily. 03/15/19   Argentina Donovan, PA-C  cetirizine (ZYRTEC) 10 MG tablet Take 1 tablet (10 mg total) by mouth at bedtime. As needed for nasal congestion 09/26/19   Fulp, Cammie, MD  cyclobenzaprine (FLEXERIL) 10 MG tablet Take 1 tablet (10 mg total) by mouth at bedtime. 08/22/19  Recardo Evangelist, PA-C  hydrochlorothiazide (HYDRODIURIL) 25 MG tablet Take 1 tablet (25 mg total) by mouth daily. 09/26/19   Fulp, Cammie, MD  meloxicam (MOBIC) 7.5 MG tablet Take 1 tablet (7.5 mg total) by mouth daily. 08/18/19   Jaynee Eagles, PA-C  ondansetron (ZOFRAN ODT) 4 MG disintegrating tablet Take 1 tablet (4 mg total) by mouth every 8 (eight) hours as needed for nausea or vomiting. 12/01/19   Shelly Coss, Hina, PA-C  metoprolol tartrate (LOPRESSOR) 100 MG tablet Take 1 tablet 2 hours prior to your Cardiac CT Patient not taking: Reported on 02/18/2019 09/05/18 08/22/19  Abigail Butts., PA-C    Allergies    Patient has no known allergies.  Review of Systems   Review of Systems  Musculoskeletal: Positive for arthralgias.  Neurological: Negative for numbness.    Physical Exam Updated Vital Signs BP (!) 143/76 (BP Location: Right Arm)   Pulse 72   Temp 98.3 F (36.8 C) (Oral)   Resp 16   Ht 5\' 2"  (1.575  m)   Wt 72.6 kg   LMP 06/04/2018 (Exact Date)   SpO2 100%   BMI 29.26 kg/m   Physical Exam Vitals and nursing note reviewed.  Constitutional:      General: She is not in acute distress.    Appearance: She is well-developed and well-nourished.     Comments: appears nontoxic  HENT:     Head: Normocephalic and atraumatic.  Eyes:     Extraocular Movements: EOM normal.     Conjunctiva/sclera: Conjunctivae normal.     Pupils: Pupils are equal, round, and reactive to light.  Neck:     Comments: No TTP of neck or midline spine. Cardiovascular:     Rate and Rhythm: Normal rate and regular rhythm.     Pulses: Normal pulses and intact distal pulses.  Pulmonary:     Effort: Pulmonary effort is normal. No respiratory distress.     Breath sounds: Normal breath sounds. No wheezing.  Abdominal:     General: There is no distension.     Palpations: Abdomen is soft. There is no mass.     Tenderness: There is no abdominal tenderness. There is no guarding or rebound.  Musculoskeletal:        General: Normal range of motion.     Cervical back: Normal range of motion and neck supple.     Comments: No obvious deformity. Radial pulses 2+ bilaterally. Grip strength equal bilaterally.  No pain with passive range of motion, increased pain with active range of motion of the left shoulder.  Tenderness palpation of the entire musculature of the left shoulder.  No obvious bony deformity.  Skin:    General: Skin is warm and dry.     Capillary Refill: Capillary refill takes less than 2 seconds.  Neurological:     Mental Status: She is alert and oriented to person, place, and time.  Psychiatric:        Mood and Affect: Mood and affect normal.     ED Results / Procedures / Treatments   Labs (all labs ordered are listed, but only abnormal results are displayed) Labs Reviewed - No data to display  EKG EKG Interpretation  Date/Time:  Tuesday March 20 2020 07:50:40 EST Ventricular Rate:  75 PR  Interval:  180 QRS Duration: 90 QT Interval:  406 QTC Calculation: 453 R Axis:   -5 Text Interpretation: Normal sinus rhythm Low voltage QRS Borderline ECG No significant change since last tracing Confirmed  by Dorie Rank [phone]) on 03/20/2020 11:59:30 AM   Radiology No results found.  Procedures Procedures (including critical care time)  Medications Ordered in ED Medications - No data to display  ED Course  I have reviewed the triage vital signs and the nursing notes.  Pertinent labs & imaging results that were available during my care of the patient were reviewed by me and considered in my medical decision making (see chart for details).    MDM Rules/Calculators/A&P                          Patient presenting for evaluation of right shoulder pain.  On exam, patient is nontoxic.  Neurovascularly intact.  Pain is reproducible with palpation of the musculature and with movement of the arm.  Likely MSK.  Patient without other cardiac symptoms, doubt ACS.  Doubt bony injury, as such I do not patient's x-ray.  Discussed antibiotic treatment with patient.  She has orthopedic doctor but I encouraged her to follow-up with the symptoms and outpatient.  At this time, patient appears safe for discharge.  Return precautions given.  Patient states she understands and agrees to plan.  Final Clinical Impression(s) / ED Diagnoses Final diagnoses:  Acute pain of left shoulder    Rx / DC Orders ED Discharge Orders         Ordered    naproxen (NAPROSYN) 500 MG tablet  2 times daily with meals        03/20/20 1217    methocarbamol (ROBAXIN) 500 MG tablet  At bedtime PRN        03/20/20 1217           Franchot Heidelberg, PA-C 03/20/20 1226    Dorie Rank, MD 03/21/20 [phone]

## 2020-03-20 NOTE — Discharge Instructions (Signed)
Take naproxen 2 times a day with meals.  Do not take other anti-inflammatories at the same time (Advil, Motrin, ibuprofen, Aleve). You may supplement with Tylenol if you need further pain control. Use Robaxin as needed for muscle stiffness or soreness. Have caution, as this may make you tired or groggy. Do not drive or operate heavy machinery while taking this medication.  Use muscle creams (bengay, icy hot, salonpas) as needed for pain.  Follow up with your orthopedic doctor.  Return to the ER if you develop high fevers, numbness, severe worsening pain, or any new, worsening, or concerning symptoms.

## 2020-03-28 ENCOUNTER — Other Ambulatory Visit: Payer: Self-pay

## 2020-03-28 ENCOUNTER — Emergency Department (HOSPITAL_COMMUNITY)
Admission: EM | Admit: 2020-03-28 | Discharge: 2020-03-29 | Disposition: A | Discharge status: Home / Self Care | Payer: Medicaid Other | Attending: Emergency Medicine | Admitting: Emergency Medicine

## 2020-03-28 ENCOUNTER — Encounter (HOSPITAL_COMMUNITY): Payer: Self-pay

## 2020-03-28 DIAGNOSIS — Z5321 Procedure and treatment not carried out due to patient leaving prior to being seen by health care provider: Secondary | ICD-10-CM | POA: Insufficient documentation

## 2020-03-28 DIAGNOSIS — R519 Headache, unspecified: Secondary | ICD-10-CM | POA: Insufficient documentation

## 2020-03-28 DIAGNOSIS — R0781 Pleurodynia: Secondary | ICD-10-CM | POA: Insufficient documentation

## 2020-03-28 NOTE — ED Triage Notes (Signed)
Patient reports headache starting middays, denies hx of migraines, also states she is having R rib pain.

## 2020-03-29 NOTE — ED Notes (Signed)
Pt called for  recheck of VS, no answer

## 2020-03-29 NOTE — ED Notes (Signed)
Pt called for vs recheck no answer!

## 2020-05-08 ENCOUNTER — Emergency Department (HOSPITAL_COMMUNITY)
Admission: EM | Admit: 2020-05-08 | Discharge: 2020-05-08 | Disposition: A | Discharge status: Home / Self Care | Payer: Self-pay | Attending: Emergency Medicine | Admitting: Emergency Medicine

## 2020-05-08 ENCOUNTER — Other Ambulatory Visit: Payer: Self-pay

## 2020-05-08 ENCOUNTER — Encounter (HOSPITAL_COMMUNITY): Payer: Self-pay | Admitting: Emergency Medicine

## 2020-05-08 DIAGNOSIS — Z87891 Personal history of nicotine dependence: Secondary | ICD-10-CM | POA: Insufficient documentation

## 2020-05-08 DIAGNOSIS — Z79899 Other long term (current) drug therapy: Secondary | ICD-10-CM | POA: Insufficient documentation

## 2020-05-08 DIAGNOSIS — K0889 Other specified disorders of teeth and supporting structures: Secondary | ICD-10-CM | POA: Insufficient documentation

## 2020-05-08 DIAGNOSIS — I10 Essential (primary) hypertension: Secondary | ICD-10-CM | POA: Insufficient documentation

## 2020-05-08 DIAGNOSIS — J449 Chronic obstructive pulmonary disease, unspecified: Secondary | ICD-10-CM | POA: Insufficient documentation

## 2020-05-08 DIAGNOSIS — J45909 Unspecified asthma, uncomplicated: Secondary | ICD-10-CM | POA: Insufficient documentation

## 2020-05-08 MED ORDER — KETOROLAC TROMETHAMINE 60 MG/2ML IM SOLN
60.0000 mg | Freq: Once | INTRAMUSCULAR | Status: DC
  Filled 2020-05-08: qty 2

## 2020-05-08 MED ORDER — AMOXICILLIN-POT CLAVULANATE 875-125 MG PO TABS
1.0000 | ORAL_TABLET | Freq: Once | ORAL | Status: DC
  Filled 2020-05-08: qty 1

## 2020-05-08 MED ORDER — AMOXICILLIN-POT CLAVULANATE 875-125 MG PO TABS: 1.0000 | ORAL_TABLET | Freq: Two times a day (BID) | ORAL | 0 refills | Status: DC

## 2020-05-08 NOTE — ED Provider Notes (Signed)
[facility] EMERGENCY DEPARTMENT Provider Note   CSN: [id number] Arrival date & time: 05/08/20  1013     History Chief Complaint  Patient presents with  . Dental Pain    Taylor Cook is a 55 y.o. female with no relevant past medical history presents the ED with complaints of left lower dental pain.  On my examination, patient reports that prior to moving down [place] from [place] 6 years ago, she had a dental extraction involving her #19 tooth.  However, she states that it was an incomplete extraction and that there is residual tooth below the gumline.  She states that yesterday she developed left-sided pain at the site of her prior extraction with mild associated left-sided jaw swelling.  She states that her swelling has since improved spontaneously.  Patient notes that she was having difficulty and pain with eating and had mild chills, but no documented fevers.  She is afebrile here in the ED and resting comfortably on my exam.  Patient reports that she used a Magic mouth wash obtained over-the-counter, with some relief.  She denies any voice change, wheezing or stridor, ongoing tobacco use, fevers, inability to swallow, shortness of breath, persistent or worsening facial swelling, sore throat, precipitating injury, or any other symptoms.  HPI     Past Medical History:  Diagnosis Date  . Asthma   . Bronchitis   . Hypertension   . Seasonal allergies     Patient Active Problem List   Diagnosis Date Noted  . Asthma 01/12/2014  . COPD (chronic obstructive pulmonary disease) ([place]) 01/12/2014  . Chest pain 01/12/2014  . Family history of premature CAD 01/12/2014  . Smoking 01/12/2014    Past Surgical History:  Procedure Laterality Date  . BREAST CYST EXCISION Left age 62  . BREAST SURGERY     removal of 2 cyst  . TUBAL LIGATION       OB History   No obstetric history on file.     Family History  Problem Relation Age of Onset  . Stroke Other         fx  . Heart attack Other        fx  . Heart disease Other        fx  . Hypertension Other        fx  . Cancer Other        fx    Social History   Tobacco Use  . Smoking status: Former Smoker    Types: Cigarettes    Quit date: 10/12/2017    Years since quitting: 2.5  . Smokeless tobacco: Never Used  Vaping Use  . Vaping Use: Never used  Substance Use Topics  . Alcohol use: No    Alcohol/week: 0.0 standard drinks  . Drug use: No    Home Medications Prior to Admission medications   Medication Sig Start Date End Date Taking? Authorizing Provider  amoxicillin-clavulanate (AUGMENTIN) 875-125 MG tablet Take 1 tablet by mouth every 12 (twelve) hours. 05/08/20  Yes Corena Herter, PA-C  albuterol (PROVENTIL HFA;VENTOLIN HFA) 108 (90 Base) MCG/ACT inhaler Inhale 2 puffs into the lungs every 6 (six) hours as needed for wheezing or shortness of breath. 02/23/18   Fulp, Cammie, MD  atorvastatin (LIPITOR) 10 MG tablet Take 1 tablet (10 mg total) by mouth daily. 03/15/19   Argentina Donovan, PA-C  cetirizine (ZYRTEC) 10 MG tablet Take 1 tablet (10 mg total) by mouth at bedtime. As needed for nasal  congestion 09/26/19   Fulp, Cammie, MD  cyclobenzaprine (FLEXERIL) 10 MG tablet Take 1 tablet (10 mg total) by mouth at bedtime. 08/22/19   Recardo Evangelist, PA-C  hydrochlorothiazide (HYDRODIURIL) 25 MG tablet Take 1 tablet (25 mg total) by mouth daily. 09/26/19   Fulp, Cammie, MD  meloxicam (MOBIC) 7.5 MG tablet Take 1 tablet (7.5 mg total) by mouth daily. 08/18/19   Jaynee Eagles, PA-C  methocarbamol (ROBAXIN) 500 MG tablet Take 1 tablet (500 mg total) by mouth at bedtime as needed for muscle spasms. 03/20/20   Caccavale, Sophia, PA-C  naproxen (NAPROSYN) 500 MG tablet Take 1 tablet (500 mg total) by mouth 2 (two) times daily with a meal. 03/20/20   Caccavale, Sophia, PA-C  ondansetron (ZOFRAN ODT) 4 MG disintegrating tablet Take 1 tablet (4 mg total) by mouth every 8 (eight) hours as needed for nausea  or vomiting. 12/01/19   Shelly Coss, Hina, PA-C  metoprolol tartrate (LOPRESSOR) 100 MG tablet Take 1 tablet 2 hours prior to your Cardiac CT Patient not taking: Reported on 02/18/2019 09/05/18 08/22/19  Abigail Butts., PA-C    Allergies    Patient has no known allergies.  Review of Systems   Review of Systems  All other systems reviewed and are negative.   Physical Exam Updated Vital Signs BP (!) 163/93 (BP Location: Left Arm)   Pulse 70   Temp 98.4 F (36.9 C) (Oral)   Resp 18   Ht 5\' 3"  (1.6 m)   Wt 72.6 kg   LMP 06/04/2018 (Exact Date)   SpO2 99%   BMI 28.34 kg/m   Physical Exam Vitals and nursing note reviewed. Exam conducted with a chaperone present.  Constitutional:      General: She is not in acute distress.    Appearance: Normal appearance. She is not toxic-appearing.  HENT:     Head: Normocephalic and atraumatic.     Comments: No maxillary or mandibular region swelling or asymmetries noted on exam.    Mouth/Throat:     Pharynx: Oropharynx is clear.     Comments: Relatively poor dentition throughout.  Evidence of prior dental extraction #19 tooth.  The area is tender, very mild erythema.  No significant swelling.  No asymmetries.  No area of fluctuance concerning for abscess that is amenable to drainage.  No other areas of tenderness or swelling.  Patent oropharynx.  No trismus.  Tolerating secretions well.  No floor of mouth swelling. Eyes:     General: No scleral icterus.    Conjunctiva/sclera: Conjunctivae normal.  Neck:     Comments: No meningismus or asymmetries. Cardiovascular:     Rate and Rhythm: Normal rate and regular rhythm.     Pulses: Normal pulses.  Pulmonary:     Effort: Pulmonary effort is normal. No respiratory distress.     Breath sounds: Normal breath sounds. No stridor. No wheezing or rales.  Musculoskeletal:     Cervical back: Normal range of motion and neck supple. No rigidity.  Skin:    General: Skin is dry.  Neurological:     Mental  Status: She is alert and oriented to person, place, and time.     GCS: GCS eye subscore is 4. GCS verbal subscore is 5. GCS motor subscore is 6.  Psychiatric:        Mood and Affect: Mood normal.        Behavior: Behavior normal.        Thought Content: Thought content normal.  ED Results / Procedures / Treatments   Labs (all labs ordered are listed, but only abnormal results are displayed) Labs Reviewed - No data to display  EKG None  Radiology No results found.  Procedures Procedures   Medications Ordered in ED Medications  ketorolac (TORADOL) injection 60 mg (has no administration in time range)  amoxicillin-clavulanate (AUGMENTIN) 875-125 MG per tablet 1 tablet (has no administration in time range)    ED Course  I have reviewed the triage vital signs and the nursing notes.  Pertinent labs & imaging results that were available during my care of the patient were reviewed by me and considered in my medical decision making (see chart for details).    MDM Rules/Calculators/A&P                          Taylor Cook was evaluated in Emergency Department on 05/08/2020 for the symptoms described in the history of present illness. She was evaluated in the context of the global COVID-19 pandemic, which necessitated consideration that the patient might be at risk for infection with the SARS-CoV-2 virus that causes COVID-19. Institutional protocols and algorithms that pertain to the evaluation of patients at risk for COVID-19 are in a state of rapid change based on information released by regulatory bodies including the CDC and federal and state organizations. These policies and algorithms were followed during the patient's care in the ED.  I personally reviewed patient's medical chart and all notes from triage and staff during today's encounter. I have also ordered and reviewed all labs and imaging that I felt to be medically necessary in the evaluation of this patient's complaints  and with consideration of their physical exam. If needed, translation services were available and utilized.   Patient presents to the ED with left-sided dental pain, but without any abscess amenable to drainage.  She does endorse mild left-sided mandibular swelling, but it has since improved and there is no warmth, redness, or other overlying skin changes.  I have low suspicion for a large mandibular or other deep tissue abscess at this time.  Do not feel as though CT imaging warranted at this time.  She denies any fevers, difficulty swallowing or breathing, or other symptoms.  Patient is able to speak in complete sentences without difficulty swallowing or trouble handling secretions.  No stridor, wheezing, tripoding, grey pseudomembrane, trismus, uvular deviation, sublingual/submandibular/submental swelling or induration, nuchal rigidity, or neck pain.  Low suspicion for deep tissue infection such as mandibular or maxillary abscess, PTA or RPA, epiglottitis, or other emergent pathology.   We will provide patient with a shot of IM Toradol for pain and inflammation and first dose of Augmentin.  Will discharge her home with continued Augmentin and provide patient with dental resources.  I will also provide her with a referral to [facility] so that she may get reestablished with a primary care provider for ongoing evaluation and management of her health and wellbeing.  ED return precautions discussed.  Patient voices understanding and is agreeable to the plan.    Final Clinical Impression(s) / ED Diagnoses Final diagnoses:  Pain, dental    Rx / DC Orders ED Discharge Orders         Ordered    amoxicillin-clavulanate (AUGMENTIN) 875-125 MG tablet  Every 12 hours        05/08/20 1059           Corena Herter, PA-C 05/08/20  [place], DO 05/08/20 1150

## 2020-05-08 NOTE — ED Triage Notes (Signed)
Pt arrived to triage POV, ambulatory, caox4. Pt c/o pain in the bottom left molars since Sunday. Pt also reports she noticed some swelling on the left side of the face. Pt denied fever, but reports chills.

## 2020-05-08 NOTE — Discharge Instructions (Signed)
Your oropharyngeal and dental exam is reassuring.  You will need to see a dentist for as soon as possible for definitive intervention regarding your the area of your previously extracted #19 tooth.  Please read the attachments.  Please re-establish care at [facility] and [facility].  Please take your Augmentin, as prescribed.  You may also take over-the-counter pain medication such as ibuprofen or Tylenol according to the label instructions unless a doctor has previously told you to avoid this type of medication (due to stomach ulcers, kidney disease or liver disease, for example).   Return to the ED if you develop worsening pain, fever, pus/drainage, difficulty breathing, worsening facial swelling, inability to swallow or open your mouth, or any other new or worsening symptoms.

## 2020-05-10 ENCOUNTER — Telehealth: Payer: Self-pay

## 2020-05-10 NOTE — Telephone Encounter (Signed)
I return Pt call, unable to speak with the Pt, according to Medicaid web Pt has BC/BS

## 2020-05-10 NOTE — Telephone Encounter (Signed)
Copied from [place] [id number]. Topic: Appointment Scheduling - Scheduling Inquiry for Clinic >> May 09, 2020  9:58 AM Taylor Cook wrote: Reason for CRM: Pt has made an appt for 3/30, is no longer on Medicaid, denied,  wants [facility] at [phone] prior to appt at end of the month   Former [facility] patient, please follow up.

## 2020-06-06 ENCOUNTER — Other Ambulatory Visit: Payer: Self-pay

## 2020-06-06 ENCOUNTER — Ambulatory Visit: Payer: Self-pay | Attending: Physician Assistant | Admitting: Physician Assistant

## 2020-06-06 NOTE — Progress Notes (Deleted)
Patient ID: Taylor Cook, female   DOB: 1965-10-20, 55 y.o.   MRN: [id number]   Virtual Visit via Telephone Note  I connected with Taylor Cook on 06/06/20 at 10:50 AM EDT by telephone and verified that I am speaking with the correct person using two identifiers.  Location: Patient: *** Provider: ***   I discussed the limitations, risks, security and privacy concerns of performing an evaluation and management service by telephone and the availability of in person appointments. I also discussed with the patient that there may be a patient responsible charge related to this service. The patient expressed understanding and agreed to proceed.   History of Present Illness: After ED visit for dental pain 05/08/2020.  From ED note: Patient presents to the ED with left-sided dental pain, but without any abscess amenable to drainage.  She does endorse mild left-sided mandibular swelling, but it has since improved and there is no warmth, redness, or other overlying skin changes.  I have low suspicion for a large mandibular or other deep tissue abscess at this time.  Do not feel as though CT imaging warranted at this time.  She denies any fevers, difficulty swallowing or breathing, or other symptoms.  Patient is able to speak in complete sentences without difficulty swallowing or trouble handling secretions.  No stridor, wheezing, tripoding, grey pseudomembrane, trismus, uvular deviation, sublingual/submandibular/submental swelling or induration, nuchal rigidity, or neck pain.  Low suspicion for deep tissue infection such as mandibular or maxillary abscess, PTA or RPA, epiglottitis, or other emergent pathology.   We will provide patient with a shot of IM Toradol for pain and inflammation and first dose of Augmentin.  Will discharge her home with continued Augmentin and provide patient with dental resources.  I will also provide her with a referral to [facility] so that she  may get reestablished with a primary care provider for ongoing evaluation and management of her health and wellbeing.   Observations/Objective:   Assessment and Plan:   Follow Up Instructions:    I discussed the assessment and treatment plan with the patient. The patient was provided an opportunity to ask questions and all were answered. The patient agreed with the plan and demonstrated an understanding of the instructions.   The patient was advised to call back or seek an in-person evaluation if the symptoms worsen or if the condition fails to improve as anticipated.  I provided *** minutes of non-face-to-face time during this encounter.   Freeman Caldron, PA-C

## 2020-10-10 ENCOUNTER — Emergency Department (HOSPITAL_COMMUNITY)
Admission: EM | Admit: 2020-10-10 | Discharge: 2020-10-10 | Disposition: A | Discharge status: Home / Self Care | Payer: Medicaid Other | Attending: Emergency Medicine | Admitting: Emergency Medicine

## 2020-10-10 ENCOUNTER — Encounter (HOSPITAL_COMMUNITY): Payer: Self-pay | Admitting: Emergency Medicine

## 2020-10-10 DIAGNOSIS — Z79899 Other long term (current) drug therapy: Secondary | ICD-10-CM | POA: Insufficient documentation

## 2020-10-10 DIAGNOSIS — Z87891 Personal history of nicotine dependence: Secondary | ICD-10-CM | POA: Insufficient documentation

## 2020-10-10 DIAGNOSIS — M25521 Pain in right elbow: Secondary | ICD-10-CM | POA: Insufficient documentation

## 2020-10-10 DIAGNOSIS — R21 Rash and other nonspecific skin eruption: Secondary | ICD-10-CM | POA: Insufficient documentation

## 2020-10-10 DIAGNOSIS — J45909 Unspecified asthma, uncomplicated: Secondary | ICD-10-CM | POA: Insufficient documentation

## 2020-10-10 DIAGNOSIS — J449 Chronic obstructive pulmonary disease, unspecified: Secondary | ICD-10-CM | POA: Insufficient documentation

## 2020-10-10 DIAGNOSIS — L989 Disorder of the skin and subcutaneous tissue, unspecified: Secondary | ICD-10-CM | POA: Insufficient documentation

## 2020-10-10 DIAGNOSIS — I10 Essential (primary) hypertension: Secondary | ICD-10-CM | POA: Insufficient documentation

## 2020-10-10 MED ORDER — NAPROXEN 500 MG PO TABS: 500.0000 mg | ORAL_TABLET | Freq: Two times a day (BID) | ORAL | 0 refills | Status: DC

## 2020-10-10 MED ORDER — NAPROXEN 500 MG PO TABS: 500.0000 mg | ORAL_TABLET | Freq: Two times a day (BID) | ORAL | 0 refills | Status: AC

## 2020-10-10 MED ORDER — TRIAMCINOLONE ACETONIDE 0.1 % EX CREA: 1.0000 "application " | TOPICAL_CREAM | Freq: Two times a day (BID) | CUTANEOUS | 0 refills | Status: DC

## 2020-10-10 NOTE — ED Provider Notes (Signed)
[facility] EMERGENCY DEPARTMENT Provider Note   CSN: [id number] Arrival date & time: 10/10/20  1229     History Chief Complaint  Patient presents with   Rash    Taylor Cook is a 55 y.o. female presenting for evaluation of rash and right elbow pain.  Patient states over the past 24 hours she has noticed an itchy rash.  She reports discrete lesions on her right arm, left arm, and right upper leg.  She has been using topical hydrocortisone cream without improvement of symptoms.  Is worse when she showers.  She denies any new environments or known bug exposure.  No one else around her has similar rash.  No fevers or chills. Additionally, patient states for the past month she has had intermittent right elbow pain.  Is worse when she first wakes up in the morning, limiting movement due to pain.  She has taken Tylenol without improvement of symptoms.  She is not been evaluated for this.  She does not have an orthopedic doctor.  She is not having pain currently.  No numbness or tingling.  HPI     Past Medical History:  Diagnosis Date   Asthma    Bronchitis    Hypertension    Seasonal allergies     Patient Active Problem List   Diagnosis Date Noted   Asthma 01/12/2014   COPD (chronic obstructive pulmonary disease) ([place]) 01/12/2014   Chest pain 01/12/2014   Family history of premature CAD 01/12/2014   Smoking 01/12/2014    Past Surgical History:  Procedure Laterality Date   BREAST CYST EXCISION Left age 71   BREAST SURGERY     removal of 2 cyst   TUBAL LIGATION       OB History   No obstetric history on file.     Family History  Problem Relation Age of Onset   Stroke Other        fx   Heart attack Other        fx   Heart disease Other        fx   Hypertension Other        fx   Cancer Other        fx    Social History   Tobacco Use   Smoking status: Former    Types: Cigarettes    Quit date: 10/12/2017    Years since quitting: 2.9    Smokeless tobacco: Never  Vaping Use   Vaping Use: Never used  Substance Use Topics   Alcohol use: No    Alcohol/week: 0.0 standard drinks   Drug use: No    Home Medications Prior to Admission medications   Medication Sig Start Date End Date Taking? Authorizing Provider  albuterol (PROVENTIL HFA;VENTOLIN HFA) 108 (90 Base) MCG/ACT inhaler Inhale 2 puffs into the lungs every 6 (six) hours as needed for wheezing or shortness of breath. 02/23/18   [facility]ulp, Cammie, MD  amoxicillin-clavulanate (AUGMENTIN) 875-125 MG tablet Take 1 tablet by mouth every 12 (twelve) hours. 05/08/20   Corena Herter, PA-C  atorvastatin (LIPITOR) 10 MG tablet Take 1 tablet (10 mg total) by mouth daily. 03/15/19   Argentina Donovan, PA-C  cetirizine (ZYRTEC) 10 MG tablet Take 1 tablet (10 mg total) by mouth at bedtime. As needed for nasal congestion 09/26/19   [facility], Cammie, MD  cyclobenzaprine (FLEXERIL) 10 MG tablet Take 1 tablet (10 mg total) by mouth at bedtime. 08/22/19   Recardo Evangelist, PA-C  hydrochlorothiazide (HYDRODIURIL) 25 MG tablet Take 1 tablet (25 mg total) by mouth daily. 09/26/19   Fulp, Cammie, MD  meloxicam (MOBIC) 7.5 MG tablet Take 1 tablet (7.5 mg total) by mouth daily. 08/18/19   Jaynee Eagles, PA-C  methocarbamol (ROBAXIN) 500 MG tablet Take 1 tablet (500 mg total) by mouth at bedtime as needed for muscle spasms. 03/20/20   Dawan Farney, PA-C  naproxen (NAPROSYN) 500 MG tablet Take 1 tablet (500 mg total) by mouth 2 (two) times daily with a meal for 7 days. 10/10/20 10/17/20  Radhika Dershem, PA-C  ondansetron (ZOFRAN ODT) 4 MG disintegrating tablet Take 1 tablet (4 mg total) by mouth every 8 (eight) hours as needed for nausea or vomiting. 12/01/19   Khatri, Hina, PA-C  triamcinolone cream (KENALOG) 0.1 % Apply 1 application topically 2 (two) times daily. 10/10/20   Caila Cirelli, PA-C  metoprolol tartrate (LOPRESSOR) 100 MG tablet Take 1 tablet 2 hours prior to your Cardiac CT Patient not taking:  Reported on 02/18/2019 09/05/18 08/22/19  Abigail Butts., PA-C    Allergies    Patient has no known allergies.  Review of Systems   Review of Systems  Musculoskeletal:  Positive for arthralgias.  Skin:  Positive for rash.   Physical Exam Updated Vital Signs BP (!) 166/93 (BP Location: Left Arm)   Pulse 78   Temp 98.7 F (37.1 C) (Oral)   Resp 18   LMP 06/04/2018 (Exact Date)   SpO2 100%   Physical Exam Vitals and nursing note reviewed.  Constitutional:      General: She is not in acute distress.    Appearance: Normal appearance.     Comments: Sitting in the bed in no acute distress  HENT:     Head: Normocephalic and atraumatic.  Eyes:     Conjunctiva/sclera: Conjunctivae normal.     Pupils: Pupils are equal, round, and reactive to light.  Cardiovascular:     Rate and Rhythm: Normal rate and regular rhythm.     Pulses: Normal pulses.  Pulmonary:     Effort: Pulmonary effort is normal. No respiratory distress.     Breath sounds: Normal breath sounds. No wheezing.     Comments: Speaking in full sentences.  Clear lung sounds in all fields. Abdominal:     General: There is no distension.     Palpations: Abdomen is soft. There is no mass.     Tenderness: There is no abdominal tenderness. There is no guarding or rebound.  Musculoskeletal:        General: Normal range of motion.     Cervical back: Normal range of motion and neck supple.     Comments: No obvious deformity of the right elbow.  No erythema or warmth.  No swelling.  Full active range of motion of the elbow without difficulty.  Radial pulse 2+ bilaterally.  Grip strength equal bilaterally.  Mild tenderness palpation over the medial epicondyle.  Skin:    General: Skin is warm and dry.     Capillary Refill: Capillary refill takes less than 2 seconds.     Findings: Rash present.     Comments: 5 discrete lesions of the extremities consistent with bug bites.  Signs of excoriation.  No vesicles or pustules.   Neurological:     Mental Status: She is alert and oriented to person, place, and time.  Psychiatric:        Mood and Affect: Mood and affect normal.  Speech: Speech normal.        Behavior: Behavior normal.    ED Results / Procedures / Treatments   Labs (all labs ordered are listed, but only abnormal results are displayed) Labs Reviewed - No data to display  EKG None  Radiology No results found.  Procedures Procedures   Medications Ordered in ED Medications - No data to display  ED Course  I have reviewed the triage vital signs and the nursing notes.  Pertinent labs & imaging results that were available during my care of the patient were reviewed by me and considered in my medical decision making (see chart for details).    MDM Rules/Calculators/A&P                           Patient presenting for rash and elbow pain.  On exam, patient appears nontoxic.  The 2 complaints are not related.  Elbow pain is likely MSK in nature, as it is intermittent and worse when she wakes up.  No obvious deformity.  I do not believe x-rays would be beneficial today.  We will have patient treat symptomatically with anti-inflammatory and follow-up with orthopedics not proving. Rash is most consistent with bug bites.  Discussed findings with patient.  Discussed symptomatic treatment and follow-up with PCP if not improving.  At this time, patient appears safe for discharge.  Return precautions given.  Patient states he understands and agrees to plan.  Final Clinical Impression(s) / ED Diagnoses Final diagnoses:  Rash and nonspecific skin eruption  Right elbow pain    Rx / DC Orders ED Discharge Orders          Ordered    naproxen (NAPROSYN) 500 MG tablet  2 times daily with meals,   Status:  Discontinued        10/10/20 1241    triamcinolone cream (KENALOG) 0.1 %  2 times daily,   Status:  Discontinued        10/10/20 1241    naproxen (NAPROSYN) 500 MG tablet  2 times daily with  meals        10/10/20 1244    triamcinolone cream (KENALOG) 0.1 %  2 times daily        10/10/20 1244             Mishelle Hassan, PA-C 10/10/20 1300    Godfrey Pick, MD 10/12/20 [phone]

## 2020-10-10 NOTE — ED Triage Notes (Signed)
Patient complains of a rash that first appeared several days ago on her left arm but has since spread onto buttocks and right arm. Patient also reports right elbow pain for the last several months. No dyspnea, no oral swelling. Patient alert, oriented, and in no apparent distress.

## 2020-10-10 NOTE — Discharge Instructions (Addendum)
Take naproxen 2 times a day with meals.  Do not take other anti-inflammatories at the same time (Advil, Motrin, ibuprofen, Aleve). You may supplement with Tylenol if you need further pain control. Use ice packs as needed for pain.  Use the Kenalog cream on the areas that itch.  Do not use on your face however, as it can cause scarring. Follow-up with your primary care doctor as needed if your rashes not proving. Follow-up with the orthopedic doctor as needed for further evaluation of your elbow. Return to the emergency room with any new, worsening, or concerning symptoms

## 2021-10-03 ENCOUNTER — Ambulatory Visit: Payer: Medicaid Other | Admitting: Physician Assistant

## 2021-10-03 ENCOUNTER — Ambulatory Visit: Payer: Self-pay

## 2021-10-03 NOTE — Telephone Encounter (Signed)
  Chief Complaint: Pt missed appt. 1 hour late Symptoms: Uterine fibroids, STI testing Frequency: ongoing Pertinent Negatives: Patient denies current pain Disposition: '[]'$ ED /'[x]'$ Urgent Care (no appt availability in office) / '[x]'$ Appointment(In office/virtual)/ '[]'$  [facility]/ '[]'$ Home Care/ '[]'$ Refused Recommended Disposition /'[]'$ [facility]/ '[]'$  Follow-up with PCP Additional Notes: PT had appt today in office pt did not know office had moved. Pt went to the old office and was 1 hour late for her appt. Office unable to see her today. Made a new appt for pt. Pt will go to UC to be seen for fibroids and STI testing. Reason for Disposition  [1] Other NON-URGENT information for PCP AND [2] does not require PCP response  Answer Assessment - Initial Assessment Questions 1. REASON FOR CALL or QUESTION: "What is your reason for calling today?" or "How can I best help you?" or "What question do you have that I can help answer?"     Pt missed her appt today. Wants to be seen today. 2. CALLER: Document the source of call. (e.g., laboratory, patient).     Pt.  Protocols used: PCP Call - No Triage-A-AH

## 2021-10-07 DIAGNOSIS — D259 Leiomyoma of uterus, unspecified: Secondary | ICD-10-CM | POA: Diagnosis not present

## 2021-12-31 ENCOUNTER — Encounter: Payer: Self-pay | Admitting: Internal Medicine

## 2021-12-31 ENCOUNTER — Other Ambulatory Visit: Payer: Self-pay

## 2021-12-31 ENCOUNTER — Ambulatory Visit: Payer: Medicaid Other | Attending: Internal Medicine | Admitting: Internal Medicine

## 2021-12-31 VITALS — BP 180/90 | HR 70 | Ht 62.0 in | Wt 142.4 lb

## 2021-12-31 DIAGNOSIS — Z7689 Persons encountering health services in other specified circumstances: Secondary | ICD-10-CM

## 2021-12-31 DIAGNOSIS — E782 Mixed hyperlipidemia: Secondary | ICD-10-CM

## 2021-12-31 DIAGNOSIS — I10 Essential (primary) hypertension: Secondary | ICD-10-CM

## 2021-12-31 DIAGNOSIS — D219 Benign neoplasm of connective and other soft tissue, unspecified: Secondary | ICD-10-CM

## 2021-12-31 DIAGNOSIS — Z1231 Encounter for screening mammogram for malignant neoplasm of breast: Secondary | ICD-10-CM

## 2021-12-31 DIAGNOSIS — J4520 Mild intermittent asthma, uncomplicated: Secondary | ICD-10-CM

## 2021-12-31 DIAGNOSIS — R110 Nausea: Secondary | ICD-10-CM

## 2021-12-31 DIAGNOSIS — Z1211 Encounter for screening for malignant neoplasm of colon: Secondary | ICD-10-CM

## 2021-12-31 DIAGNOSIS — Z23 Encounter for immunization: Secondary | ICD-10-CM

## 2021-12-31 MED ORDER — ONDANSETRON 4 MG PO TBDP
4.0000 mg | ORAL_TABLET | Freq: Two times a day (BID) | ORAL | 0 refills | Status: AC | PRN
  Filled 2021-12-31: qty 20, 10d supply, fill #0

## 2021-12-31 MED ORDER — ALBUTEROL SULFATE HFA 108 (90 BASE) MCG/ACT IN AERS
2.0000 | INHALATION_SPRAY | Freq: Four times a day (QID) | RESPIRATORY_TRACT | 2 refills | Status: DC | PRN
  Filled 2021-12-31: qty 6.7, 25d supply, fill #0

## 2021-12-31 MED ORDER — AMLODIPINE BESYLATE 5 MG PO TABS
5.0000 mg | ORAL_TABLET | Freq: Every day | ORAL | 1 refills | Status: DC
  Filled 2021-12-31: qty 30, 30d supply, fill #0

## 2021-12-31 MED ORDER — HYDROCHLOROTHIAZIDE 12.5 MG PO TABS
12.5000 mg | ORAL_TABLET | Freq: Every day | ORAL | 1 refills | Status: DC
  Filled 2021-12-31: qty 30, 30d supply, fill #0

## 2021-12-31 NOTE — Progress Notes (Signed)
Patient ID: Taylor Cook, female    DOB: 1965-10-04  MRN: [id number]  CC: Hot Flashes, Night Sweats, Fibroids, and Medication Refill   Subjective: Taylor Cook is a 56 y.o. female who presents for new pt visit.  Last seen here 3 yrs ago Her concerns today include:  Hx of HTN, HL, asthma, former smoker, PFO with L-RT shunt, LN enlargement in chest dating back to 2016 ? Sarcoid.  Pt with hx of HTN but out of med x 2 yrs.  No CP/SOB/LE.  Endorses HA for past 4 days.   Limits salt in foods.   No device to check BP  Hx of COPD/asthma.  Needing RF on Albuterol inhaler to use PRN.  Endorses intermittent cough.  Some wheezing last yr.  No ER visit for breathing issues in last yr.  Quit smoking 4 yrs ago  HL:  out Lipitor for 2 yrs  Request RF on Zofran to use PRN.  Gets nausea intermittently that last 1 hr then goes away for several wks.  Seen in ER at [facility] 08/27/21, and 10/07/2021 for pain LLQ abdomen going into LT leg.  Had CT scan that showed fibroids.  She is postmenopausal since age 22.    HM:  needs pap, MMG, colon CA screen, flu shot.   Patient Active Problem List   Diagnosis Date Noted   Asthma 01/12/2014   COPD (chronic obstructive pulmonary disease) ([place]) 01/12/2014   Chest pain 01/12/2014   Family history of premature CAD 01/12/2014   Smoking 01/12/2014     Current Outpatient Medications on File Prior to Visit  Medication Sig Dispense Refill   atorvastatin (LIPITOR) 10 MG tablet Take 1 tablet (10 mg total) by mouth daily. 90 tablet 3   [DISCONTINUED] metoprolol tartrate (LOPRESSOR) 100 MG tablet Take 1 tablet 2 hours prior to your Cardiac CT (Patient not taking: Reported on 02/18/2019) 1 tablet 0   No current facility-administered medications on file prior to visit.    No Known Allergies  Social History   Socioeconomic History   Marital status: Married    Spouse name: Not on file   Number of children: Not on file   Years of education: Not on  file   Highest education level: Not on file  Occupational History   Not on file  Tobacco Use   Smoking status: Former    Types: Cigarettes    Quit date: 10/12/2017    Years since quitting: 4.2   Smokeless tobacco: Never  Vaping Use   Vaping Use: Never used  Substance and Sexual Activity   Alcohol use: No    Alcohol/week: 0.0 standard drinks of alcohol   Drug use: No   Sexual activity: Not Currently  Other Topics Concern   Not on file  Social History Narrative   Not on file   Social Determinants of Health   Financial Resource Strain: Not on file  Food Insecurity: Not on file  Transportation Needs: Not on file  Physical Activity: Not on file  Stress: Not on file  Social Connections: Not on file  Intimate Partner Violence: Not on file    Family History  Problem Relation Age of Onset   Stroke Other        fx   Heart attack Other        fx   Heart disease Other        fx   Hypertension Other        fx  Cancer Other        fx    Past Surgical History:  Procedure Laterality Date   BREAST CYST EXCISION Left age 80   BREAST SURGERY     removal of 2 cyst   TUBAL LIGATION      ROS: Review of Systems Negative except as stated above  PHYSICAL EXAM: BP (!) 180/90   Pulse 70   Ht '5\' 2"'$  (1.575 m)   Wt 142 lb 6.4 oz (64.6 kg)   LMP 06/04/2018 (Exact Date)   SpO2 100%   BMI 26.05 kg/m   Physical Exam  General appearance - alert, well appearing, middle-age African-American female and in no distress Mental status - normal mood, behavior, speech, dress, motor activity, and thought processes Eyes -slightly pale conjunctiva Mouth - mucous membranes moist, pharynx normal without lesions Neck - supple, no significant adenopathy Chest - clear to auscultation, no wheezes, rales or rhonchi, symmetric air entry Heart - normal rate, regular rhythm, normal S1, S2, no murmurs, rubs, clicks or gallops Abdomen: Normal bowel sounds, soft and nontender.  No palpable masses  organomegaly appreciated. Extremities - peripheral pulses normal, no pedal edema, no clubbing or cyanosis      Latest Ref Rng & Units 12/01/2019   11:05 AM 11/30/2019   11:49 PM 03/10/2019    9:29 AM  CMP  Glucose 70 - 99 mg/dL 109  95  102   BUN 6 - 20 mg/dL '20  23  21   '$ Creatinine 0.44 - 1.00 mg/dL 0.91  0.96  0.76   Sodium 135 - 145 mmol/L 140  139  139   Potassium 3.5 - 5.1 mmol/L 3.6  3.5  4.0   Chloride 98 - 111 mmol/L 101  98  99   CO2 22 - 32 mmol/L '29  27  25   '$ Calcium 8.9 - 10.3 mg/dL 9.7  9.9  10.0   Total Protein 6.5 - 8.1 g/dL 8.3  8.5  8.2   Total Bilirubin 0.3 - 1.2 mg/dL 1.1  0.7  0.4   Alkaline Phos 38 - 126 U/L 60  76  127   AST 15 - 41 U/L '27  26  25   '$ ALT 0 - 44 U/L '22  22  18    '$ Lipid Panel     Component Value Date/Time   CHOL 251 (H) 03/10/2019 0929   TRIG 118 03/10/2019 0929   HDL 70 03/10/2019 0929   CHOLHDL 3.6 03/10/2019 0929   CHOLHDL 3 01/12/2014 0907   VLDL 12.8 01/12/2014 0907   LDLCALC 160 (H) 03/10/2019 0929    CBC    Component Value Date/Time   WBC 4.2 12/01/2019 1105   RBC 4.46 12/01/2019 1105   HGB 13.6 12/01/2019 1105   HCT 41.4 12/01/2019 1105   PLT 269 12/01/2019 1105   MCV 92.8 12/01/2019 1105   MCH 30.5 12/01/2019 1105   MCHC 32.9 12/01/2019 1105   RDW 12.8 12/01/2019 1105   LYMPHSABS 1.9 10/02/2017 1650   MONOABS 0.5 10/02/2017 1650   EOSABS 0.1 10/02/2017 1650   BASOSABS 0.0 10/02/2017 1650    ASSESSMENT AND PLAN: 1. Establishing care with new doctor, encounter for   2. Essential hypertension Not at goal of 130/80 or lower We will start her on amlodipine 5 mg daily and HCTZ 12.5 mg daily.  DASH diet discussed and encouraged.  Follow-up with clinical pharmacist in 2 weeks for repeat blood pressure check. - CBC - Comprehensive metabolic panel - hydrochlorothiazide (  HYDRODIURIL) 12.5 MG tablet; Take 1 tablet (12.5 mg total) by mouth daily.  Dispense: 90 tablet; Refill: 1 - amLODipine (NORVASC) 5 MG tablet; Take 1  tablet (5 mg total) by mouth daily.  Dispense: 90 tablet; Refill: 1  3. Mixed hyperlipidemia We will recheck lipid profile to see whether she still needs to be on cholesterol-lowering medication. - Lipid panel  4. Mild intermittent asthma without complication stable and does not report frequent flares. - albuterol (VENTOLIN HFA) 108 (90 Base) MCG/ACT inhaler; Inhale 2 puffs into the lungs every 6 (six) hours as needed for wheezing or shortness of breath.  Dispense: 1 each; Refill: 2  5. Nausea in adult - ondansetron (ZOFRAN ODT) 4 MG disintegrating tablet; Take 1 tablet (4 mg total) by mouth 2 (two) times daily as needed for nausea or vomiting.  Dispense: 20 tablet; Refill: 0  6. Fibroid Patient to sign release for [place] to get the abdominal CAT scan report from her ER visit.  Advised that in postmenopausal women, gynecologist usually do not do anything about fibroids unless they are causing some compressive symptoms  7. Screening for colon cancer Discussed colon CA screening methods.  She prefers c-scope - Ambulatory referral to Gastroenterology  8. Encounter for screening mammogram for malignant neoplasm of breast - MM Digital Screening; Future  9. Need for immunization against influenza - Flu Vaccine QUAD 43moIM (Fluarix, Fluzone & Alfiuria Quad PF)  Follow-up with me in 7 weeks for Pap  Patient was given the opportunity to ask questions.  Patient verbalized understanding of the plan and was able to repeat key elements of the plan.   This documentation was completed using DRadio producer  Any transcriptional errors are unintentional.  Orders Placed This Encounter  Procedures   MM Digital Screening   Flu Vaccine QUAD 621moM (Fluarix, Fluzone & Alfiuria Quad PF)   CBC   Comprehensive metabolic panel   Lipid panel   Ambulatory referral to Gastroenterology     Requested Prescriptions   Signed Prescriptions Disp Refills   hydrochlorothiazide (HYDRODIURIL)  12.5 MG tablet 90 tablet 1    Sig: Take 1 tablet (12.5 mg total) by mouth daily.   amLODipine (NORVASC) 5 MG tablet 90 tablet 1    Sig: Take 1 tablet (5 mg total) by mouth daily.   albuterol (VENTOLIN HFA) 108 (90 Base) MCG/ACT inhaler 1 each 2    Sig: Inhale 2 puffs into the lungs every 6 (six) hours as needed for wheezing or shortness of breath.   ondansetron (ZOFRAN ODT) 4 MG disintegrating tablet 20 tablet 0    Sig: Take 1 tablet (4 mg total) by mouth 2 (two) times daily as needed for nausea or vomiting.    Return in about 7 weeks (around 02/18/2022) for Appt Luke in 2 wks BP check.  Sign release to get CAT scan of abdomen report from [facility]  DeKarle PlumberMD, FACP

## 2021-12-31 NOTE — Patient Instructions (Signed)
Please sign a release when you go through checkout for [place] to get your records of CAT scan report from [place].  Your blood pressure is uncontrolled.  We have started you on blood pressure medications called amlodipine 5 mg daily and hydrochlorothiazide 12.5 mg daily.  Follow-up with our clinical pharmacist in 2 weeks for repeat blood pressure check.

## 2022-01-01 ENCOUNTER — Other Ambulatory Visit: Payer: Self-pay

## 2022-01-01 ENCOUNTER — Other Ambulatory Visit: Payer: Self-pay | Admitting: Internal Medicine

## 2022-01-01 LAB — COMPREHENSIVE METABOLIC PANEL
ALT: 15 IU/L (ref 0–32)
AST: 17 IU/L (ref 0–40)
Albumin/Globulin Ratio: 1.8 (ref 1.2–2.2)
Albumin: 4.8 g/dL (ref 3.8–4.9)
Alkaline Phosphatase: 106 IU/L (ref 44–121)
BUN/Creatinine Ratio: 17 (ref 9–23)
BUN: 11 mg/dL (ref 6–24)
Bilirubin Total: 0.5 mg/dL (ref 0.0–1.2)
CO2: 22 mmol/L (ref 20–29)
Calcium: 9.5 mg/dL (ref 8.7–10.2)
Chloride: 102 mmol/L (ref 96–106)
Creatinine, Ser: 0.64 mg/dL (ref 0.57–1.00)
Globulin, Total: 2.6 g/dL (ref 1.5–4.5)
Glucose: 84 mg/dL (ref 70–99)
Potassium: 3.8 mmol/L (ref 3.5–5.2)
Sodium: 142 mmol/L (ref 134–144)
Total Protein: 7.4 g/dL (ref 6.0–8.5)
eGFR: 104 mL/min/{1.73_m2} (ref >59)

## 2022-01-01 LAB — CBC
Hematocrit: 41.7 % (ref 34.0–46.6)
Hemoglobin: 14.3 g/dL (ref 11.1–15.9)
MCH: 31.8 pg (ref 26.6–33.0)
MCHC: 34.3 g/dL (ref 31.5–35.7)
MCV: 93 fL (ref 79–97)
Platelets: 296 10*3/uL (ref 150–450)
RBC: 4.49 x10E6/uL (ref 3.77–5.28)
RDW: 12.8 % (ref 11.7–15.4)
WBC: 5.5 10*3/uL (ref 3.4–10.8)

## 2022-01-01 LAB — LIPID PANEL
Chol/HDL Ratio: 2.6 ratio (ref 0.0–4.4)
Cholesterol, Total: 206 mg/dL — ABNORMAL HIGH (ref 100–199)
HDL: 80 mg/dL (ref >39)
LDL Chol Calc (NIH): 114 mg/dL — ABNORMAL HIGH (ref 0–99)
Triglycerides: 64 mg/dL (ref 0–149)
VLDL Cholesterol Cal: 12 mg/dL (ref 5–40)

## 2022-01-01 MED ORDER — ATORVASTATIN CALCIUM 10 MG PO TABS
10.0000 mg | ORAL_TABLET | Freq: Every day | ORAL | 1 refills | Status: DC
  Filled 2022-01-01 – 2022-01-09 (×2): qty 30, 30d supply, fill #0

## 2022-01-01 NOTE — Progress Notes (Signed)
Let patient know that cholesterol level has improved but still elevated.  I recommend restarting the atorvastatin to help lower cholesterol.  Prescription sent to our pharmacy. -Kidney and liver function tests good. -Blood cell count normal.  The rest of this is for my information. The 10-year ASCVD risk score (Arnett DK, et al., 2019) is: 11.2%   Values used to calculate the score:     Age: 56 years     Sex: Female     Is Non-Hispanic African American: Yes     Diabetic: No     Tobacco smoker: No     Systolic Blood Pressure: 701 mmHg     Is BP treated: Yes     HDL Cholesterol: 80 mg/dL     Total Cholesterol: 206 mg/dL

## 2022-01-07 ENCOUNTER — Other Ambulatory Visit: Payer: Self-pay

## 2022-01-09 ENCOUNTER — Other Ambulatory Visit: Payer: Self-pay

## 2022-02-07 ENCOUNTER — Encounter: Payer: Self-pay | Admitting: Pharmacist

## 2022-02-07 ENCOUNTER — Ambulatory Visit: Payer: Commercial Managed Care - HMO | Attending: Internal Medicine | Admitting: Pharmacist

## 2022-02-07 ENCOUNTER — Other Ambulatory Visit: Payer: Self-pay

## 2022-02-07 VITALS — BP 130/77 | HR 68

## 2022-02-07 DIAGNOSIS — E785 Hyperlipidemia, unspecified: Secondary | ICD-10-CM | POA: Insufficient documentation

## 2022-02-07 DIAGNOSIS — Z823 Family history of stroke: Secondary | ICD-10-CM | POA: Insufficient documentation

## 2022-02-07 DIAGNOSIS — I10 Essential (primary) hypertension: Secondary | ICD-10-CM | POA: Diagnosis not present

## 2022-02-07 DIAGNOSIS — Z79899 Other long term (current) drug therapy: Secondary | ICD-10-CM | POA: Diagnosis not present

## 2022-02-07 DIAGNOSIS — J45909 Unspecified asthma, uncomplicated: Secondary | ICD-10-CM | POA: Diagnosis not present

## 2022-02-07 DIAGNOSIS — D869 Sarcoidosis, unspecified: Secondary | ICD-10-CM | POA: Diagnosis not present

## 2022-02-07 DIAGNOSIS — Q2112 Patent foramen ovale: Secondary | ICD-10-CM | POA: Diagnosis not present

## 2022-02-07 DIAGNOSIS — Z8249 Family history of ischemic heart disease and other diseases of the circulatory system: Secondary | ICD-10-CM | POA: Diagnosis not present

## 2022-02-07 DIAGNOSIS — Z87891 Personal history of nicotine dependence: Secondary | ICD-10-CM | POA: Diagnosis not present

## 2022-02-07 MED ORDER — HYDROCHLOROTHIAZIDE 25 MG PO TABS
25.0000 mg | ORAL_TABLET | Freq: Every day | ORAL | 3 refills | Status: DC
  Filled 2022-02-07: qty 30, 30d supply, fill #0

## 2022-02-07 NOTE — Progress Notes (Signed)
S:     No chief complaint on file.  56 y.o. female who presents for hypertension evaluation, education, and management.  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 Sarcoid. Patient was referred and last seen by Primary Care Provider, Dr. Wynetta Emery, on 12/31/2021. At that visit, HCTZ and amlodipine started.   Today, patient arrives in good spirits and presents without assistance. Denies dizziness, blurred vision, swelling. She was still experiencing a HA daily after her PCP visit in October, so she increased her HCTZ to two tabs (27m total). She is still taking amlodipine 5 mg daily as prescribed. She tells me that since self-increasing to 24mdaily of HCTZ, her HA has resolved.   Patient reports hypertension is longstanding.   Family/Social history:  Fhx: stroke, MI, HTN Tobacco: former smoker Alcohol: none reported   Medication adherence reported. Patient has not taken BP medications today.   Current antihypertensives include: amlodipine 5 mg daily, HCTZ 12.5 mg (pt is taking 25 mg daily)  Reported home BP readings: none  Patient reported dietary habits:  -Uses sodium-free seasoning: Mrs. DaDeliah BostonDenies eating a lot of fast food or take-out foods  -No excessive caffeine intake reported   Patient-reported exercise habits:  -Very active in her work life. Works 3 jobs daily.   O:  Vitals:   02/07/22 1103  BP: 130/77  Pulse: 68   Last 3 Office BP readings: BP Readings from Last 3 Encounters:  02/07/22 130/77  12/31/21 (!) 180/90  10/10/20 (!) 166/93   BMET    Component Value Date/Time   NA 142 12/31/2021 1208   K 3.8 12/31/2021 1208   CL 102 12/31/2021 1208   CO2 22 12/31/2021 1208   GLUCOSE 84 12/31/2021 1208   GLUCOSE 109 (H) 12/01/2019 1105   BUN 11 12/31/2021 1208   CREATININE 0.64 12/31/2021 1208   CALCIUM 9.5 12/31/2021 1208   GFRNONAA >60 12/01/2019 1105   GFRAA >60 12/01/2019  1105    Renal function: CrCl cannot be calculated (Patient's most recent lab result is older than the maximum 21 days allowed.).  Clinical ASCVD: No  The 10-year ASCVD risk score (Arnett DK, et al., 2019) is: 3.7%   Values used to calculate the score:     Age: [age]     Sex: Female     Is Non-Hispanic African American: Yes     Diabetic: No     Tobacco smoker: No     Systolic Blood Pressure: 13563mHg     Is BP treated: Yes     HDL Cholesterol: 80 mg/dL     Total Cholesterol: 206 mg/dL  Patient is participating in a Managed Medicaid Plan:  Yes    A/P: Hypertension diagnosed currently at goal on current medications. BP goal < 130/80 mmHg. Medication adherence appears appropriate, however, she is taking 2537mf HCTZ daily instead of the 12.5mg18mescribed. I will send in an updated rx for 25mg36mly and get a BMP today. -Continued amlodipine 5 mg daily.  -Continue HCTZ 25 mg daily. Updated rx sent for 25mg 51met sent to our pharmacy.  -Patient educated on purpose, proper use, and potential adverse effects of amlodipine, HCTZ. -F/u labs ordered - BMP8+eGFR -Counseled on lifestyle modifications for blood pressure control including reduced dietary sodium, increased exercise, adequate sleep. -Encouraged patient to check BP at home and bring log of readings to next visit. Counseled on proper use  of home BP cuff.    Results reviewed and written information provided.    Written patient instructions provided. Patient verbalized understanding of treatment plan.  Total time in face to face counseling 30 minutes.    Follow-up:  Pharmacist prn. PCP clinic visit 02/20/2022.  Benard Halsted, PharmD, [facility] [address] [phone]

## 2022-02-08 LAB — BMP8+EGFR
BUN/Creatinine Ratio: 20 (ref 9–23)
BUN: 14 mg/dL (ref 6–24)
CO2: 23 mmol/L (ref 20–29)
Calcium: 9.4 mg/dL (ref 8.7–10.2)
Chloride: 104 mmol/L (ref 96–106)
Creatinine, Ser: 0.71 mg/dL (ref 0.57–1.00)
Glucose: 76 mg/dL (ref 70–99)
Potassium: 3.5 mmol/L (ref 3.5–5.2)
Sodium: 144 mmol/L (ref 134–144)
eGFR: 100 mL/min/{1.73_m2} (ref >59)

## 2022-02-20 ENCOUNTER — Other Ambulatory Visit (HOSPITAL_COMMUNITY)
Admission: RE | Admit: 2022-02-20 | Discharge: 2022-02-20 | Disposition: A | Discharge status: Home / Self Care | Payer: Commercial Managed Care - HMO | Source: Ambulatory Visit | Attending: Internal Medicine | Admitting: Internal Medicine

## 2022-02-20 ENCOUNTER — Ambulatory Visit: Payer: Commercial Managed Care - HMO | Attending: Internal Medicine | Admitting: Internal Medicine

## 2022-02-20 ENCOUNTER — Encounter: Payer: Self-pay | Admitting: Internal Medicine

## 2022-02-20 ENCOUNTER — Other Ambulatory Visit: Payer: Self-pay

## 2022-02-20 VITALS — BP 140/84 | HR 69 | Temp 97.9°F | Ht 62.0 in | Wt 145.0 lb

## 2022-02-20 DIAGNOSIS — Z1159 Encounter for screening for other viral diseases: Secondary | ICD-10-CM | POA: Insufficient documentation

## 2022-02-20 DIAGNOSIS — Z114 Encounter for screening for human immunodeficiency virus [HIV]: Secondary | ICD-10-CM | POA: Diagnosis not present

## 2022-02-20 DIAGNOSIS — Z1231 Encounter for screening mammogram for malignant neoplasm of breast: Secondary | ICD-10-CM

## 2022-02-20 DIAGNOSIS — F321 Major depressive disorder, single episode, moderate: Secondary | ICD-10-CM

## 2022-02-20 DIAGNOSIS — Z124 Encounter for screening for malignant neoplasm of cervix: Secondary | ICD-10-CM | POA: Diagnosis present

## 2022-02-20 DIAGNOSIS — I10 Essential (primary) hypertension: Secondary | ICD-10-CM | POA: Diagnosis not present

## 2022-02-20 DIAGNOSIS — F411 Generalized anxiety disorder: Secondary | ICD-10-CM | POA: Diagnosis not present

## 2022-02-20 MED ORDER — SERTRALINE HCL 25 MG PO TABS
25.0000 mg | ORAL_TABLET | Freq: Every day | ORAL | 3 refills | Status: DC
  Filled 2022-02-20: qty 30, 30d supply, fill #0

## 2022-02-20 NOTE — Progress Notes (Signed)
Patient ID: Taylor Cook, female    DOB: Nov 13, 1965  MRN: [id number]  CC: Hypertension (HTN f/u. Med refill/Pain on R foot, /Discuss mammogram & colon cancer test, pap smear./Already received flu vax.)   Subjective: Taylor Cook is a 56 y.o. 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 Sarcoid, asthma   GYN History:  Pt is G7P4 (2 aborted and 1 MC) Any hx of abn paps?: no Menses regular or irregular?: NA, menopausal How long does menses last?  Menstrual flow light or heavy?:  Method of birth control?:  NA Any vaginal dischg at this time?:  a little white dischg Dysuria?:  no Any hx of STI?: no Sexually active with how many partners:  yes with one female partner Desires STI screen:  yes.  Also HIV/Hep C Last MMG: ordered on last visit.  No calls as yet Family hx of uterine, cervical or breast cancer?:  not sure  HTN:  did not take meds as yet for the morning.  Checks BP occasionally.  She is on Norvasc 5 mg daily and HCTZ 25 mg daily.  She has been limiting salt in the foods.  Anx/Dep:  for past 1.5 yrs Contributing factors are stress, loneliness and overwhelm by work and family No SI Would like med but no counseling (works 4 jobs 7 days a wk and does not feel she would have time) Never on med.    Marland Kitchen  HM:  no call for MMG as yet.  Called for colonoscopy appointment by [place] on the first of this month.  Patient states she did not receive the call.  I told her that I will copy their information including phone number so that she can give them a call to schedule the colonoscopy.  She is agreeable to this.  Patient Active Problem List   Diagnosis Date Noted   Asthma 01/12/2014   COPD (chronic obstructive pulmonary disease) ([place]) 01/12/2014   Chest pain 01/12/2014   Family history of premature CAD 01/12/2014   Smoking 01/12/2014     Current Outpatient  Medications on File Prior to Visit  Medication Sig Dispense Refill   albuterol (VENTOLIN HFA) 108 (90 Base) MCG/ACT inhaler Inhale 2 puffs into the lungs every 6 (six) hours as needed for wheezing or shortness of breath. 6.7 g 2   amLODipine (NORVASC) 5 MG tablet Take 1 tablet (5 mg total) by mouth daily. 90 tablet 1   atorvastatin (LIPITOR) 10 MG tablet Take 1 tablet (10 mg total) by mouth daily. 90 tablet 1   hydrochlorothiazide (HYDRODIURIL) 25 MG tablet Take 1 tablet (25 mg total) by mouth daily. 90 tablet 3   ondansetron (ZOFRAN-ODT) 4 MG disintegrating tablet Take 1 tablet (4 mg total) by mouth 2 (two) times daily as needed for nausea or vomiting. 20 tablet 0   [DISCONTINUED] metoprolol tartrate (LOPRESSOR) 100 MG tablet Take 1 tablet 2 hours prior to your Cardiac CT (Patient not taking: Reported on 02/18/2019) 1 tablet 0   No current facility-administered medications on file prior to visit.    No Known Allergies  Social History   Socioeconomic History   Marital status: Married    Spouse name: Not on file   Number of children: Not on file   Years of education: Not on file   Highest education level: Not on file  Occupational History   Not  on file  Tobacco Use   Smoking status: Former    Types: Cigarettes    Quit date: 10/12/2017    Years since quitting: 4.3   Smokeless tobacco: Never  Vaping Use   Vaping Use: Never used  Substance and Sexual Activity   Alcohol use: No    Alcohol/week: 0.0 standard drinks of alcohol   Drug use: No   Sexual activity: Not Currently  Other Topics Concern   Not on file  Social History Narrative   Not on file   Social Determinants of Health   Financial Resource Strain: Not on file  Food Insecurity: Not on file  Transportation Needs: Not on file  Physical Activity: Not on file  Stress: Not on file  Social Connections: Not on file  Intimate Partner Violence: Not on file    Family History  Problem Relation Age of Onset   Stroke Other         fx   Heart attack Other        fx   Heart disease Other        fx   Hypertension Other        fx   Cancer Other        fx    Past Surgical History:  Procedure Laterality Date   BREAST CYST EXCISION Left age 42   BREAST SURGERY     removal of 2 cyst   TUBAL LIGATION      ROS: Review of Systems Negative except as stated above  PHYSICAL EXAM: BP (!) 140/84   Pulse 69   Temp 97.9 F (36.6 C) (Oral)   Ht '5\' 2"'$  (1.575 m)   Wt 145 lb (65.8 kg)   LMP 06/04/2018 (Exact Date)   SpO2 100%   BMI 26.52 kg/m   Physical Exam  General appearance - alert, well appearing, older African-American female and in no distress Mental status - normal mood, behavior, speech, dress, motor activity, and thought processes Breasts -CMA Clarissa present for breast and pelvic exams: Breasts appear normal, no suspicious masses, no skin or nipple changes or axillary nodes Pelvic - normal external genitalia, vulva, vagina, cervix, uterus and adnexa Extremities - peripheral pulses normal, no pedal edema, no clubbing or cyanosis     02/20/2022   11:06 AM 12/31/2021   10:43 AM 07/08/2018    2:33 PM  Depression screen PHQ 2/9  Decreased Interest 1 1 0  Down, Depressed, Hopeless 2 0 0  PHQ - 2 Score 3 1 0  Altered sleeping 3  3  Tired, decreased energy 2  1  Change in appetite 1  0  Feeling bad or failure about yourself  0  0  Trouble concentrating 2  0  Moving slowly or fidgety/restless 2  0  Suicidal thoughts 0  0  PHQ-9 Score 13  4  Difficult doing work/chores   Somewhat difficult      02/20/2022   11:07 AM 12/31/2021   10:43 AM 07/08/2018    2:32 PM 12/31/2017    4:30 PM  GAD 7 : Generalized Anxiety Score  Nervous, Anxious, on Edge 0 0 0 0  Control/stop worrying 1 0 0 0  Worry too much - different things 3 2 0 0  Trouble relaxing '3 2 3 1  '$ Restless 2 2 0 0  Easily annoyed or irritable 0 0 0 0  Afraid - awful might happen 2 0 0 0  Total GAD 7 Score '11 6 3 '$ 1  Anxiety  Difficulty   Somewhat difficult          Latest Ref Rng & Units 02/07/2022   10:59 AM 12/31/2021   12:08 PM 12/01/2019   11:05 AM  CMP  Glucose 70 - 99 mg/dL 76  84  109   BUN 6 - 24 mg/dL '14  11  20   '$ Creatinine 0.57 - 1.00 mg/dL 0.71  0.64  0.91   Sodium 134 - 144 mmol/L 144  142  140   Potassium 3.5 - 5.2 mmol/L 3.5  3.8  3.6   Chloride 96 - 106 mmol/L 104  102  101   CO2 20 - 29 mmol/L '23  22  29   '$ Calcium 8.7 - 10.2 mg/dL 9.4  9.5  9.7   Total Protein 6.0 - 8.5 g/dL  7.4  8.3   Total Bilirubin 0.0 - 1.2 mg/dL  0.5  1.1   Alkaline Phos 44 - 121 IU/L  106  60   AST 0 - 40 IU/L  17  27   ALT 0 - 32 IU/L  15  22    Lipid Panel     Component Value Date/Time   CHOL 206 (H) 12/31/2021 1208   TRIG 64 12/31/2021 1208   HDL 80 12/31/2021 1208   CHOLHDL 2.6 12/31/2021 1208   CHOLHDL 3 01/12/2014 0907   VLDL 12.8 01/12/2014 0907   LDLCALC 114 (H) 12/31/2021 1208    CBC    Component Value Date/Time   WBC 5.5 12/31/2021 1208   WBC 4.2 12/01/2019 1105   RBC 4.49 12/31/2021 1208   RBC 4.46 12/01/2019 1105   HGB 14.3 12/31/2021 1208   HCT 41.7 12/31/2021 1208   PLT 296 12/31/2021 1208   MCV 93 12/31/2021 1208   MCH 31.8 12/31/2021 1208   MCH 30.5 12/01/2019 1105   MCHC 34.3 12/31/2021 1208   MCHC 32.9 12/01/2019 1105   RDW 12.8 12/31/2021 1208   LYMPHSABS 1.9 10/02/2017 1650   MONOABS 0.5 10/02/2017 1650   EOSABS 0.1 10/02/2017 1650   BASOSABS 0.0 10/02/2017 1650    ASSESSMENT AND PLAN: 1. Pap smear for cervical cancer screening - Cytology - PAP - Cervicovaginal ancillary only  2. Major depressive disorder, single episode, moderate (HCC) We discussed management of depression and anxiety which is done with medication and or cognitive behavioral therapy.  Patient feels that time would not allow for her to do counseling but she would like to try medication.  She is agreeable to being started on Zoloft 25 mg daily.  Advised that if she has any increased depression or  anxiety on the medicine, she should stop the medication and give me a call.  Denies any suicidal ideation at this time. - sertraline (ZOLOFT) 25 MG tablet; Take 1 tablet (25 mg total) by mouth daily.  Dispense: 30 tablet; Refill: 3  3. GAD (generalized anxiety disorder) See #2 above - sertraline (ZOLOFT) 25 MG tablet; Take 1 tablet (25 mg total) by mouth daily.  Dispense: 30 tablet; Refill: 3  4. Encounter for screening mammogram for malignant neoplasm of breast Resubmit referral for mammogram - MM Digital Screening; Future  5. Screening for HIV (human immunodeficiency virus) - HIV antibody (with reflex)  6. Need for hepatitis C screening test - Hepatitis C Antibody  .7 essential hypertension Repeat blood pressure today is better.  Continue HCTZ and Norvasc.  Take her medicines as soon as she returns home.  I have printed information for [facility] gastroenterology  so that she can call them to schedule her colonoscopy.  Patient was given the opportunity to ask questions.  Patient verbalized understanding of the plan and was able to repeat key elements of the plan.   This documentation was completed using Radio producer.  Any transcriptional errors are unintentional.  Orders Placed This Encounter  Procedures   MM Digital Screening   HIV antibody (with reflex)   Hepatitis C Antibody     Requested Prescriptions   Signed Prescriptions Disp Refills   sertraline (ZOLOFT) 25 MG tablet 30 tablet 3    Sig: Take 1 tablet (25 mg total) by mouth daily.    Return in about 6 weeks (around 04/03/2022).  Karle Plumber, MD, FACP

## 2022-02-20 NOTE — Patient Instructions (Addendum)
[facility] called you 02/07/2022 to scheduled a colonoscopy.  Please call them at PH# [phone]   Start Zoloft 25 mg daily to help with depression/Anxiety.

## 2022-02-21 LAB — CERVICOVAGINAL ANCILLARY ONLY
Bacterial Vaginitis (gardnerella): NEGATIVE
Candida Glabrata: NEGATIVE
Candida Vaginitis: NEGATIVE
Chlamydia: NEGATIVE
Comment: NEGATIVE
Comment: NEGATIVE
Comment: NEGATIVE
Comment: NEGATIVE
Comment: NEGATIVE
Comment: NORMAL
Neisseria Gonorrhea: NEGATIVE
Trichomonas: NEGATIVE

## 2022-02-21 LAB — HIV ANTIBODY (ROUTINE TESTING W REFLEX): HIV Screen 4th Generation wRfx: NONREACTIVE

## 2022-02-21 LAB — HEPATITIS C ANTIBODY: Hep C Virus Ab: NONREACTIVE

## 2022-02-24 LAB — CYTOLOGY - PAP
Comment: NEGATIVE
Diagnosis: REACTIVE
High risk HPV: NEGATIVE

## 2022-04-03 ENCOUNTER — Ambulatory Visit: Payer: Medicaid Other | Admitting: Internal Medicine

## 2022-04-24 ENCOUNTER — Ambulatory Visit: Payer: Commercial Managed Care - HMO

## 2022-06-12 ENCOUNTER — Other Ambulatory Visit: Payer: Self-pay

## 2022-06-12 ENCOUNTER — Ambulatory Visit: Payer: Self-pay | Attending: Internal Medicine | Admitting: Internal Medicine

## 2022-06-12 ENCOUNTER — Encounter: Payer: Self-pay | Admitting: Internal Medicine

## 2022-06-12 VITALS — BP 155/83 | HR 79 | Temp 98.5°F | Ht 62.0 in | Wt 145.0 lb

## 2022-06-12 DIAGNOSIS — Z23 Encounter for immunization: Secondary | ICD-10-CM

## 2022-06-12 DIAGNOSIS — E782 Mixed hyperlipidemia: Secondary | ICD-10-CM

## 2022-06-12 DIAGNOSIS — Z79899 Other long term (current) drug therapy: Secondary | ICD-10-CM | POA: Insufficient documentation

## 2022-06-12 DIAGNOSIS — J4520 Mild intermittent asthma, uncomplicated: Secondary | ICD-10-CM

## 2022-06-12 DIAGNOSIS — R0602 Shortness of breath: Secondary | ICD-10-CM | POA: Insufficient documentation

## 2022-06-12 DIAGNOSIS — F321 Major depressive disorder, single episode, moderate: Secondary | ICD-10-CM

## 2022-06-12 DIAGNOSIS — Z87891 Personal history of nicotine dependence: Secondary | ICD-10-CM | POA: Insufficient documentation

## 2022-06-12 DIAGNOSIS — D869 Sarcoidosis, unspecified: Secondary | ICD-10-CM | POA: Insufficient documentation

## 2022-06-12 DIAGNOSIS — Q2112 Patent foramen ovale: Secondary | ICD-10-CM | POA: Insufficient documentation

## 2022-06-12 DIAGNOSIS — F329 Major depressive disorder, single episode, unspecified: Secondary | ICD-10-CM | POA: Insufficient documentation

## 2022-06-12 DIAGNOSIS — I10 Essential (primary) hypertension: Secondary | ICD-10-CM

## 2022-06-12 DIAGNOSIS — F17210 Nicotine dependence, cigarettes, uncomplicated: Secondary | ICD-10-CM

## 2022-06-12 DIAGNOSIS — F17200 Nicotine dependence, unspecified, uncomplicated: Secondary | ICD-10-CM

## 2022-06-12 DIAGNOSIS — Z1211 Encounter for screening for malignant neoplasm of colon: Secondary | ICD-10-CM

## 2022-06-12 DIAGNOSIS — N951 Menopausal and female climacteric states: Secondary | ICD-10-CM | POA: Insufficient documentation

## 2022-06-12 DIAGNOSIS — F411 Generalized anxiety disorder: Secondary | ICD-10-CM

## 2022-06-12 DIAGNOSIS — Z1231 Encounter for screening mammogram for malignant neoplasm of breast: Secondary | ICD-10-CM

## 2022-06-12 DIAGNOSIS — R232 Flushing: Secondary | ICD-10-CM

## 2022-06-12 DIAGNOSIS — I2510 Atherosclerotic heart disease of native coronary artery without angina pectoris: Secondary | ICD-10-CM | POA: Insufficient documentation

## 2022-06-12 DIAGNOSIS — J45909 Unspecified asthma, uncomplicated: Secondary | ICD-10-CM | POA: Insufficient documentation

## 2022-06-12 MED ORDER — GABAPENTIN 600 MG PO TABS
300.0000 mg | ORAL_TABLET | Freq: Every day | ORAL | 1 refills | Status: AC
  Filled 2022-06-12: qty 15, 30d supply, fill #0
  Filled 2022-07-22: qty 15, 30d supply, fill #1

## 2022-06-12 MED ORDER — SERTRALINE HCL 50 MG PO TABS
50.0000 mg | ORAL_TABLET | Freq: Every day | ORAL | 1 refills | Status: AC
  Filled 2022-06-12: qty 30, 30d supply, fill #0
  Filled 2022-07-22: qty 30, 30d supply, fill #1

## 2022-06-12 MED ORDER — AMLODIPINE BESYLATE 5 MG PO TABS
5.0000 mg | ORAL_TABLET | Freq: Every day | ORAL | 1 refills | Status: AC
  Filled 2022-06-12: qty 30, 30d supply, fill #0
  Filled 2022-07-22: qty 30, 30d supply, fill #1

## 2022-06-12 MED ORDER — ALBUTEROL SULFATE HFA 108 (90 BASE) MCG/ACT IN AERS
2.0000 | INHALATION_SPRAY | Freq: Four times a day (QID) | RESPIRATORY_TRACT | 2 refills | Status: AC | PRN
  Filled 2022-06-12: qty 6.7, 25d supply, fill #0

## 2022-06-12 MED ORDER — HYDROCHLOROTHIAZIDE 25 MG PO TABS
25.0000 mg | ORAL_TABLET | Freq: Every day | ORAL | 3 refills | Status: AC
  Filled 2022-06-12: qty 30, 30d supply, fill #0
  Filled 2022-07-22: qty 30, 30d supply, fill #1

## 2022-06-12 MED ORDER — ATORVASTATIN CALCIUM 10 MG PO TABS
10.0000 mg | ORAL_TABLET | Freq: Every day | ORAL | 1 refills | Status: AC
  Filled 2022-06-12: qty 30, 30d supply, fill #0
  Filled 2022-07-22: qty 30, 30d supply, fill #1

## 2022-06-12 NOTE — Progress Notes (Signed)
Patient ID: Taylor Cook, female    DOB: 1965/10/03  MRN: [id number]  CC: Hypertension (HTN f/u. Med refills. /Reports unable to sleep (1hr of sleep). Hot flashes at night./Discuss zofran. Velta Addison to shingles vax. )   Subjective: Taylor Cook is a 57 y.o. 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 Sarcoid, asthma   HTN: Should be on Norvasc 5 mg daily and HCTZ 25 mg daily.  Taking consistently and took today HCTZ, out of Norvasc. Limits salt in foods No CP/LE.  Occasional SOB  GAD/MDD: Started on Zoloft 25 mg on last visit in December.  Taking and finds it helpful; feels she would benefit for higher dose  HL: Should be on atorvastatin.  Did get it and taking.  Last LDL was 114 and ASCVD was 11.2%.  Needs RF on Lipitor  Not sleeping well due to hot flashes; worse at nights. Postmenopausal since age 44.  Started smoking again  a mth ago. 3 cigarettes/day    HM: Referred for mammogram on last visit but it has not had it done.  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  Can do it if offered on the weekends..  Given phone number for [facility] to call and schedule her colonoscopy but that has not been done as yet either.  Did call but could not take the appt due to work schedule.  She is agreeable to doing the fit test instead.    Patient Active Problem List   Diagnosis Date Noted   Asthma 01/12/2014   Chest pain 01/12/2014   Family history of premature CAD 01/12/2014   Smoking 01/12/2014     Current Outpatient Medications on File Prior to Visit  Medication Sig Dispense Refill   ondansetron (ZOFRAN-ODT) 4 MG disintegrating tablet Take 1 tablet (4 mg total) by mouth 2 (two) times daily as needed for nausea or vomiting. 20 tablet 0   [DISCONTINUED] metoprolol  tartrate (LOPRESSOR) 100 MG tablet Take 1 tablet 2 hours prior to your Cardiac CT (Patient not taking: Reported on 02/18/2019) 1 tablet 0   No current facility-administered medications on file prior to visit.    No Known Allergies  Social History   Socioeconomic History   Marital status: Married    Spouse name: Not on file   Number of children: Not on file   Years of education: Not on file   Highest education level: Not on file  Occupational History   Not on file  Tobacco Use   Smoking status: Former    Types: Cigarettes    Quit date: 10/12/2017    Years since quitting: 4.6   Smokeless tobacco: Never  Vaping Use   Vaping Use: Never used  Substance and Sexual Activity   Alcohol use: No    Alcohol/week: 0.0 standard drinks of alcohol   Drug use: No   Sexual activity: Not Currently  Other Topics Concern   Not on file  Social History Narrative   Not on file   Social Determinants of Health   Financial Resource Strain: Not on file  Food Insecurity: Not on file  Transportation Needs: Not on file  Physical Activity: Not on file  Stress: Not on file  Social Connections: Not on file  Intimate Partner Violence: Not on  file    Family History  Problem Relation Age of Onset   Stroke Other        fx   Heart attack Other        fx   Heart disease Other        fx   Hypertension Other        fx   Cancer Other        fx    Past Surgical History:  Procedure Laterality Date   BREAST CYST EXCISION Left age 50   BREAST SURGERY     removal of 2 cyst   TUBAL LIGATION      ROS: Review of Systems Negative except as stated above  PHYSICAL EXAM: BP (!) 155/83 (BP Location: Left Arm, Patient Position: Sitting, Cuff Size: Normal)   Pulse 79   Temp 98.5 F (36.9 C) (Oral)   Ht 5\' 2"  (1.575 m)   Wt 145 lb (65.8 kg)   LMP 06/04/2018 (Exact Date)   SpO2 98%   BMI 26.52 kg/m   Physical Exam  General appearance - alert, well appearing, middle-age African-American female  and in no distress Mental status - normal mood, behavior, speech, dress, motor activity, and thought processes Chest - clear to auscultation, no wheezes, rales or rhonchi, symmetric air entry Heart - normal rate, regular rhythm, normal S1, S2, no murmurs, rubs, clicks or gallops Extremities - peripheral pulses normal, no pedal edema, no clubbing or cyanosis      Latest Ref Rng & Units 02/07/2022   10:59 AM 12/31/2021   12:08 PM 12/01/2019   11:05 AM  CMP  Glucose 70 - 99 mg/dL 76  84  109   BUN 6 - 24 mg/dL 14  11  20    Creatinine 0.57 - 1.00 mg/dL 0.71  0.64  0.91   Sodium 134 - 144 mmol/L 144  142  140   Potassium 3.5 - 5.2 mmol/L 3.5  3.8  3.6   Chloride 96 - 106 mmol/L 104  102  101   CO2 20 - 29 mmol/L 23  22  29    Calcium 8.7 - 10.2 mg/dL 9.4  9.5  9.7   Total Protein 6.0 - 8.5 g/dL  7.4  8.3   Total Bilirubin 0.0 - 1.2 mg/dL  0.5  1.1   Alkaline Phos 44 - 121 IU/L  106  60   AST 0 - 40 IU/L  17  27   ALT 0 - 32 IU/L  15  22    Lipid Panel     Component Value Date/Time   CHOL 206 (H) 12/31/2021 1208   TRIG 64 12/31/2021 1208   HDL 80 12/31/2021 1208   CHOLHDL 2.6 12/31/2021 1208   CHOLHDL 3 01/12/2014 0907   VLDL 12.8 01/12/2014 0907   LDLCALC 114 (H) 12/31/2021 1208    CBC    Component Value Date/Time   WBC 5.5 12/31/2021 1208   WBC 4.2 12/01/2019 1105   RBC 4.49 12/31/2021 1208   RBC 4.46 12/01/2019 1105   HGB 14.3 12/31/2021 1208   HCT 41.7 12/31/2021 1208   PLT 296 12/31/2021 1208   MCV 93 12/31/2021 1208   MCH 31.8 12/31/2021 1208   MCH 30.5 12/01/2019 1105   MCHC 34.3 12/31/2021 1208   MCHC 32.9 12/01/2019 1105   RDW 12.8 12/31/2021 1208   LYMPHSABS 1.9 10/02/2017 1650   MONOABS 0.5 10/02/2017 1650   EOSABS 0.1 10/02/2017 1650   BASOSABS 0.0 10/02/2017 1650  ASSESSMENT AND PLAN:  1. Essential hypertension Not at goal.  She has been out of amlodipine.  Refills given.  Continue HCTZ as well. - hydrochlorothiazide (HYDRODIURIL) 25 MG tablet;  Take 1 tablet (25 mg total) by mouth daily.  Dispense: 90 tablet; Refill: 3 - amLODipine (NORVASC) 5 MG tablet; Take 1 tablet (5 mg total) by mouth daily.  Dispense: 90 tablet; Refill: 1  2. Mixed hyperlipidemia Continue atorvastatin.  Plan to recheck lipid profile on next visit. - atorvastatin (LIPITOR) 10 MG tablet; Take 1 tablet (10 mg total) by mouth daily.  Dispense: 90 tablet; Refill: 1  3. Major depressive disorder, single episode, moderate Reports some benefit from Zoloft.  We will increase the dose to 50 mg daily. - sertraline (ZOLOFT) 50 MG tablet; Take 1 tablet (50 mg total) by mouth daily.  Dispense: 90 tablet; Refill: 1  4. GAD (generalized anxiety disorder) See #3 above - sertraline (ZOLOFT) 50 MG tablet; Take 1 tablet (50 mg total) by mouth daily.  Dispense: 90 tablet; Refill: 1  5. Mild intermittent asthma without complication Patient requested refill on Ventolin inhaler. - albuterol (VENTOLIN HFA) 108 (90 Base) MCG/ACT inhaler; Inhale 2 puffs into the lungs every 6 (six) hours as needed for wheezing or shortness of breath.  Dispense: 6.7 g; Refill: 2  6. Tobacco dependence Strongly encouraged her to quit.  Discussed health risks associated with smoking.  7. Hot flashes I am reluctant to put her on HRT without having an updated mammogram.  Last mammogram was 4 years ago.  Encouraged her to get this done as soon as possible.  Increased Zoloft may help.  We discussed trying her with low-dose gabapentin to take at bedtime.  Advised that the medication can cause drowsiness. - gabapentin (NEURONTIN) 600 MG tablet; Take 0.5 tablets (300 mg total) by mouth at bedtime.  Dispense: 45 tablet; Refill: 1  8. Encounter for screening mammogram for malignant neoplasm of breast When she is called, she will inquire whether mobile mammography is offered on the weekends - MM Digital Screening; Future  9. Screening for colon cancer - Fecal occult blood,  imunochemical([facility])  10. Need for shingles vaccine Patient was agreeable to receiving the Shingrix vaccine series.  First shot administered today.    Patient was given the opportunity to ask questions.  Patient verbalized understanding of the plan and was able to repeat key elements of the plan.   This documentation was completed using Radio producer.  Any transcriptional errors are unintentional.  Orders Placed This Encounter  Procedures   Fecal occult blood, imunochemical([facility])   MM Digital Screening   Varicella-zoster vaccine IM     Requested Prescriptions   Signed Prescriptions Disp Refills   sertraline (ZOLOFT) 50 MG tablet 90 tablet 1    Sig: Take 1 tablet (50 mg total) by mouth daily.   hydrochlorothiazide (HYDRODIURIL) 25 MG tablet 90 tablet 3    Sig: Take 1 tablet (25 mg total) by mouth daily.   atorvastatin (LIPITOR) 10 MG tablet 90 tablet 1    Sig: Take 1 tablet (10 mg total) by mouth daily.   amLODipine (NORVASC) 5 MG tablet 90 tablet 1    Sig: Take 1 tablet (5 mg total) by mouth daily.   albuterol (VENTOLIN HFA) 108 (90 Base) MCG/ACT inhaler 6.7 g 2    Sig: Inhale 2 puffs into the lungs every 6 (six) hours as needed for wheezing or shortness of breath.   gabapentin (NEURONTIN) 600 MG tablet 45  tablet 1    Sig: Take 0.5 tablets (300 mg total) by mouth at bedtime.    No follow-ups on file.  Karle Plumber, MD, FACP

## 2022-06-12 NOTE — Patient Instructions (Signed)
Your blood pressure is not at goal.  You have been out of your blood pressure medicine called amlodipine.  I have sent refills on amlodipine and hydrochlorothiazide.  We have increased the dose of the sertraline to 50 mg daily.  This is the medication for depression/anxiety.  Please try to get your mammogram done as soon as possible.  We have placed you on gabapentin 300 mg at bedtime to help with hot flashes.

## 2022-06-13 ENCOUNTER — Other Ambulatory Visit: Payer: Self-pay

## 2022-06-18 ENCOUNTER — Telehealth: Payer: Self-pay | Admitting: Internal Medicine

## 2022-06-18 ENCOUNTER — Encounter: Payer: Self-pay | Admitting: Gastroenterology

## 2022-06-18 DIAGNOSIS — R195 Other fecal abnormalities: Secondary | ICD-10-CM

## 2022-06-18 LAB — FECAL OCCULT BLOOD, IMMUNOCHEMICAL: Fecal Occult Bld: POSITIVE — AB

## 2022-06-18 NOTE — Telephone Encounter (Signed)
Phone call placed to patient this morning.  Patient informed that the FIT test came back positive.  This means that she will need to see a gastroenterologist to have a colonoscopy done to look in the colon to see if there are any polyps or signs of colon cancer.  Patient is agreeable to the referral.

## 2022-06-26 ENCOUNTER — Ambulatory Visit (AMBULATORY_SURGERY_CENTER): Payer: Self-pay | Admitting: *Deleted

## 2022-06-26 ENCOUNTER — Other Ambulatory Visit: Payer: Self-pay

## 2022-06-26 ENCOUNTER — Ambulatory Visit
Admission: RE | Admit: 2022-06-26 | Discharge: 2022-06-26 | Disposition: A | Discharge status: Home / Self Care | Payer: 59 | Source: Ambulatory Visit | Attending: Internal Medicine | Admitting: Internal Medicine

## 2022-06-26 VITALS — Ht 61.0 in | Wt 145.0 lb

## 2022-06-26 DIAGNOSIS — Z1231 Encounter for screening mammogram for malignant neoplasm of breast: Secondary | ICD-10-CM

## 2022-06-26 DIAGNOSIS — R195 Other fecal abnormalities: Secondary | ICD-10-CM

## 2022-06-26 DIAGNOSIS — Z1211 Encounter for screening for malignant neoplasm of colon: Secondary | ICD-10-CM

## 2022-06-26 MED ORDER — NA SULFATE-K SULFATE-MG SULF 17.5-3.13-1.6 GM/177ML PO SOLN
1.0000 | Freq: Once | ORAL | 0 refills | Status: AC
  Filled 2022-06-26 – 2022-07-22 (×2): qty 354, 2d supply, fill #0

## 2022-06-26 NOTE — Progress Notes (Signed)
No egg or soy allergy known to patient  No issues known to pt with past sedation with any surgeries or procedures Patient denies ever being told they had issues or difficulty with intubation  No FH of Malignant Hyperthermia Pt is not on diet pills Pt is not on  home 02  Pt is not on blood thinners  Pt stated "I don"t go to the bathroom like everyone one else do" ,extra miralax given to take with prep.  No A fib or A flutter Have any cardiac testing pending--NO Pt instructed to use Singlecare.com or GoodRx for a price reduction on prep    Patient's chart reviewed by Cathlyn Parsons CNRA prior to previsit and patient appropriate for the [facility].  Previsit completed and red dot placed by patient's name on their procedure day (on provider's schedule).

## 2022-07-02 ENCOUNTER — Other Ambulatory Visit: Payer: Self-pay

## 2022-07-14 ENCOUNTER — Encounter: Payer: Self-pay | Admitting: Gastroenterology

## 2022-07-19 ENCOUNTER — Encounter: Payer: Self-pay | Admitting: Certified Registered Nurse Anesthetist

## 2022-07-22 ENCOUNTER — Other Ambulatory Visit: Payer: Self-pay

## 2022-07-24 ENCOUNTER — Ambulatory Visit: Payer: 59 | Admitting: Gastroenterology

## 2022-07-24 ENCOUNTER — Encounter: Payer: Self-pay | Admitting: Gastroenterology

## 2022-07-24 VITALS — BP 152/76 | HR 73 | Temp 97.1°F | Ht 61.0 in | Wt 145.0 lb

## 2022-07-24 MED ORDER — NA SULFATE-K SULFATE-MG SULF 17.5-3.13-1.6 GM/177ML PO SOLN: 1.0000 | Freq: Once | ORAL | 0 refills | Status: AC

## 2022-07-24 MED ORDER — SODIUM CHLORIDE 0.9 % IV SOLN: 500.0000 mL | Freq: Once | INTRAVENOUS | Status: DC

## 2022-07-24 NOTE — Progress Notes (Signed)
Pt's states no medical or surgical changes since previsit or office visit. 

## 2022-07-24 NOTE — Progress Notes (Addendum)
Patient ate solid food all day yesterday, noodles at 1200and a cheeseburger and french fries at 1800 last night, last stool is cloudy liquid brown with pieces of stool present per patient. Dr. Lavon Paganini notified, orders taken to r/s to 07/29/22 at 1100, for patient to take one capful of Mirilax daily starting today up to her next appointment and take Suprep again as per protocol. Charge nurse has printed out new prep instructions for patient. Patient verbalizes understanding.

## 2022-07-24 NOTE — Patient Instructions (Signed)
YOU HAD AN ENDOSCOPIC PROCEDURE TODAY AT THE [facility]:   Refer to the procedure report that was given to you for any specific questions about what was found during the examination.  If the procedure report does not answer your questions, please call your gastroenterologist to clarify.  If you requested that your care partner not be given the details of your procedure findings, then the procedure report has been included in a sealed envelope for you to review at your convenience later.  YOU SHOULD EXPECT: Some feelings of bloating in the abdomen. Passage of more gas than usual.  Walking can help get rid of the air that was put into your GI tract during the procedure and reduce the bloating. If you had a lower endoscopy (such as a colonoscopy or flexible sigmoidoscopy) you may notice spotting of blood in your stool or on the toilet paper. If you underwent a bowel prep for your procedure, you may not have a normal bowel movement for a few days.  Please Note:  You might notice some irritation and congestion in your nose or some drainage.  This is from the oxygen used during your procedure.  There is no need for concern and it should clear up in a day or so.  SYMPTOMS TO REPORT IMMEDIATELY:  Following lower endoscopy (colonoscopy or flexible sigmoidoscopy):  Excessive amounts of blood in the stool  Significant tenderness or worsening of abdominal pains  Swelling of the abdomen that is new, acute  Fever of 100F or higher  For urgent or emergent issues, a gastroenterologist can be reached at any hour by calling [phone]. Do not use MyChart messaging for urgent concerns.    DIET:  We do recommend a small meal at first, but then you may proceed to your regular diet.  Drink plenty of fluids but you should avoid alcoholic beverages for 24 hours.  ACTIVITY:  You should plan to take it easy for the rest of today and you should NOT DRIVE or use heavy machinery until tomorrow (because of  the sedation medicines used during the test).    FOLLOW UP: Our staff will call the number listed on your records the next business day following your procedure.  We will call around 7:15- 8:00 am to check on you and address any questions or concerns that you may have regarding the information given to you following your procedure. If we do not reach you, we will leave a message.     If any biopsies were taken you will be contacted by phone or by letter within the next 1-3 weeks.  Please call us at [phone] if you have not heard about the biopsies in 3 weeks.    SIGNATURES/CONFIDENTIALITY: You and/or your care partner have signed paperwork which will be entered into your electronic medical record.  These signatures attest to the fact that that the information above on your After Visit Summary has been reviewed and is understood.  Full responsibility of the confidentiality of this discharge information lies with you and/or your care-partner.  

## 2022-07-29 ENCOUNTER — Encounter: Payer: Self-pay | Admitting: Gastroenterology

## 2022-07-29 ENCOUNTER — Ambulatory Visit (AMBULATORY_SURGERY_CENTER): Payer: 59 | Admitting: Gastroenterology

## 2022-07-29 VITALS — BP 140/76 | HR 72 | Temp 98.0°F | Resp 22 | Ht 61.0 in | Wt 145.0 lb

## 2022-07-29 DIAGNOSIS — Z1211 Encounter for screening for malignant neoplasm of colon: Secondary | ICD-10-CM

## 2022-07-29 DIAGNOSIS — R195 Other fecal abnormalities: Secondary | ICD-10-CM | POA: Diagnosis not present

## 2022-07-29 DIAGNOSIS — D120 Benign neoplasm of cecum: Secondary | ICD-10-CM

## 2022-07-29 DIAGNOSIS — D122 Benign neoplasm of ascending colon: Secondary | ICD-10-CM

## 2022-07-29 MED ORDER — SODIUM CHLORIDE 0.9 % IV SOLN
500.0000 mL | INTRAVENOUS | Status: DC
Start: 2022-07-29 — End: 2022-07-29

## 2022-07-29 NOTE — Patient Instructions (Addendum)
-   Resume previous diet. - Continue present medications. - Await pathology results. - Repeat colonoscopy in 3 years for surveillance based on pathology results and for surveillance of multiple polyps.  YOU HAD AN ENDOSCOPIC PROCEDURE TODAY AT THE [facility]:   Refer to the procedure report that was given to you for any specific questions about what was found during the examination.  If the procedure report does not answer your questions, please call your gastroenterologist to clarify.  If you requested that your care partner not be given the details of your procedure findings, then the procedure report has been included in a sealed envelope for you to review at your convenience later.  YOU SHOULD EXPECT: Some feelings of bloating in the abdomen. Passage of more gas than usual.  Walking can help get rid of the air that was put into your GI tract during the procedure and reduce the bloating. If you had a lower endoscopy (such as a colonoscopy or flexible sigmoidoscopy) you may notice spotting of blood in your stool or on the toilet paper. If you underwent a bowel prep for your procedure, you may not have a normal bowel movement for a few days.  Please Note:  You might notice some irritation and congestion in your nose or some drainage.  This is from the oxygen used during your procedure.  There is no need for concern and it should clear up in a day or so.  SYMPTOMS TO REPORT IMMEDIATELY:  Following lower endoscopy (colonoscopy or flexible sigmoidoscopy):  Excessive amounts of blood in the stool  Significant tenderness or worsening of abdominal pains  Swelling of the abdomen that is new, acute  Fever of 100F or higher  For urgent or emergent issues, a gastroenterologist can be reached at any hour by calling [phone]. Do not use MyChart messaging for urgent concerns.    DIET:  We do recommend a small meal at first, but then you may proceed to your regular diet.  Drink plenty of  fluids but you should avoid alcoholic beverages for 24 hours.  ACTIVITY:  You should plan to take it easy for the rest of today and you should NOT DRIVE or use heavy machinery until tomorrow (because of the sedation medicines used during the test).    FOLLOW UP: Our staff will call the number listed on your records the next business day following your procedure.  We will call around 7:15- 8:00 am to check on you and address any questions or concerns that you may have regarding the information given to you following your procedure. If we do not reach you, we will leave a message.     If any biopsies were taken you will be contacted by phone or by letter within the next 1-3 weeks.  Please call us at [phone] if you have not heard about the biopsies in 3 weeks.    SIGNATURES/CONFIDENTIALITY: You and/or your care partner have signed paperwork which will be entered into your electronic medical record.  These signatures attest to the fact that that the information above on your After Visit Summary has been reviewed and is understood.  Full responsibility of the confidentiality of this discharge information lies with you and/or your care-partner.

## 2022-07-29 NOTE — Progress Notes (Signed)
Uneventful anesthetic. Report to pacu rn. Vss. Care resumed by rn. 

## 2022-07-29 NOTE — Progress Notes (Signed)
Called to room to assist during endoscopic procedure.  Patient ID and intended procedure confirmed with present staff. Received instructions for my participation in the procedure from the performing physician.  

## 2022-07-29 NOTE — Progress Notes (Signed)
[facility] History and Physical   Primary Care Physician:  Marcine Matar, MD   Reason for Procedure:  Positive FIT  Plan:    Colonoscopy with possible interventions as needed     HPI: Taylor Cook is a very pleasant 57 y.o. female here for colonoscopy for evaluation of positive FIT   The risks and benefits as well as alternatives of endoscopic procedure(s) have been discussed and reviewed. All questions answered. The patient agrees to proceed.    Past Medical History:  Diagnosis Date   Anxiety    Arthritis    Asthma    Bronchitis    Depression    Hyperlipidemia    Hypertension    Seasonal allergies     Past Surgical History:  Procedure Laterality Date   BREAST CYST EXCISION Left age 83   BREAST SURGERY     removal of 2 cyst   TUBAL LIGATION      Prior to Admission medications   Medication Sig Start Date End Date Taking? Authorizing Provider  albuterol (VENTOLIN HFA) 108 (90 Base) MCG/ACT inhaler Inhale 2 puffs into the lungs every 6 (six) hours as needed for wheezing or shortness of breath. 06/12/22  Yes Marcine Matar, MD  amLODipine (NORVASC) 5 MG tablet Take 1 tablet (5 mg total) by mouth daily. 06/12/22  Yes Marcine Matar, MD  atorvastatin (LIPITOR) 10 MG tablet Take 1 tablet (10 mg total) by mouth daily. 06/12/22  Yes Marcine Matar, MD  gabapentin (NEURONTIN) 600 MG tablet Take 0.5 tablets (300 mg total) by mouth at bedtime. 06/12/22  Yes Marcine Matar, MD  hydrochlorothiazide (HYDRODIURIL) 25 MG tablet Take 1 tablet (25 mg total) by mouth daily. 06/12/22  Yes Marcine Matar, MD  ondansetron (ZOFRAN-ODT) 4 MG disintegrating tablet Take 1 tablet (4 mg total) by mouth 2 (two) times daily as needed for nausea or vomiting. 12/31/21  Yes Marcine Matar, MD  sertraline (ZOLOFT) 50 MG tablet Take 1 tablet (50 mg total) by mouth daily. 06/12/22  Yes Marcine Matar, MD  metoprolol tartrate (LOPRESSOR) 100 MG tablet Take 1 tablet 2  hours prior to your Cardiac CT 09/05/18 08/22/19  Beatriz Stallion., PA-C    Current Outpatient Medications  Medication Sig Dispense Refill   albuterol (VENTOLIN HFA) 108 (90 Base) MCG/ACT inhaler Inhale 2 puffs into the lungs every 6 (six) hours as needed for wheezing or shortness of breath. 6.7 g 2   amLODipine (NORVASC) 5 MG tablet Take 1 tablet (5 mg total) by mouth daily. 90 tablet 1   atorvastatin (LIPITOR) 10 MG tablet Take 1 tablet (10 mg total) by mouth daily. 90 tablet 1   gabapentin (NEURONTIN) 600 MG tablet Take 0.5 tablets (300 mg total) by mouth at bedtime. 45 tablet 1   hydrochlorothiazide (HYDRODIURIL) 25 MG tablet Take 1 tablet (25 mg total) by mouth daily. 90 tablet 3   ondansetron (ZOFRAN-ODT) 4 MG disintegrating tablet Take 1 tablet (4 mg total) by mouth 2 (two) times daily as needed for nausea or vomiting. 20 tablet 0   sertraline (ZOLOFT) 50 MG tablet Take 1 tablet (50 mg total) by mouth daily. 90 tablet 1   Current Facility-Administered Medications  Medication Dose Route Frequency Provider Last Rate Last Admin   0.9 %  sodium chloride infusion  500 mL Intravenous Continuous Farrin Shadle, Eleonore Chiquito, MD        Allergies as of 07/29/2022   (No Known Allergies)    Family History  Problem Relation Age of Onset   Colon polyps Father    Stroke Other        fx   Heart attack Other        fx   Heart disease Other        fx   Hypertension Other        fx   Cancer Other        fx   Breast cancer Neg Hx    Colon cancer Neg Hx    Crohn's disease Neg Hx    Esophageal cancer Neg Hx    Rectal cancer Neg Hx    Stomach cancer Neg Hx    Ulcerative colitis Neg Hx     Social History   Socioeconomic History   Marital status: Married    Spouse name: Not on file   Number of children: Not on file   Years of education: Not on file   Highest education level: Not on file  Occupational History   Not on file  Tobacco Use   Smoking status: Some Days    Types: Cigarettes     Last attempt to quit: 10/12/2017    Years since quitting: 4.7   Smokeless tobacco: Never   Tobacco comments:    3 cigs/day  Vaping Use   Vaping Use: Never used  Substance and Sexual Activity   Alcohol use: No    Alcohol/week: 0.0 standard drinks of alcohol   Drug use: No   Sexual activity: Not Currently  Other Topics Concern   Not on file  Social History Narrative   Not on file   Social Determinants of Health   Financial Resource Strain: Not on file  Food Insecurity: Not on file  Transportation Needs: Not on file  Physical Activity: Not on file  Stress: Not on file  Social Connections: Not on file  Intimate Partner Violence: Not on file    Review of Systems:  All other review of systems negative except as mentioned in the HPI.  Physical Exam: Vital signs in last 24 hours: Blood Pressure 139/67   Pulse 72   Temperature 98 F (36.7 C)   Height 5\' 1"  (1.549 m)   Weight 145 lb (65.8 kg)   Last Menstrual Period 06/04/2018 (Exact Date)   Oxygen Saturation 97%   Body Mass Index 27.40 kg/m  General:   Alert, NAD Lungs:  Clear .   Heart:  Regular rate and rhythm Abdomen:  Soft, nontender and nondistended. Neuro/Psych:  Alert and cooperative. Normal mood and affect. A and O x 3  Reviewed labs, radiology imaging, old records and pertinent past GI work up  Patient is appropriate for planned procedure(s) and anesthesia in an ambulatory setting   K. Scherry Ran , MD [phone]

## 2022-07-29 NOTE — Op Note (Addendum)
[facility] Patient Name: Taylor Cook Procedure Date: 07/29/2022 11:05 AM MRN: [id number] Endoscopist: Napoleon Form , MD, [id number] Age: 57 Referring MD:  Date of Birth: 07-Jun-1965 Gender: Female Account #: [id number] Procedure:                Colonoscopy Indications:              Positive fecal immunochemical test Medicines:                Monitored Anesthesia Care Procedure:                Pre-Anesthesia Assessment:                           - Prior to the procedure, a History and Physical                            was performed, and patient medications and                            allergies were reviewed. The patient's tolerance of                            previous anesthesia was also reviewed. The risks                            and benefits of the procedure and the sedation                            options and risks were discussed with the patient.                            All questions were answered, and informed consent                            was obtained. Prior Anticoagulants: The patient has                            taken no anticoagulant or antiplatelet agents. ASA                            Grade Assessment: II - A patient with mild systemic                            disease. After reviewing the risks and benefits,                            the patient was deemed in satisfactory condition to                            undergo the procedure.                           After obtaining informed consent, the colonoscope  was passed under direct vision. Throughout the                            procedure, the patient's blood pressure, pulse, and                            oxygen saturations were monitored continuously. The                            Olympus PCF-H190DL (#[id number]) Colonoscope was                            introduced through the anus and advanced to the the                            cecum, identified  by appendiceal orifice and                            ileocecal valve. The colonoscopy was performed                            without difficulty. The patient tolerated the                            procedure well. The quality of the bowel                            preparation was good. The ileocecal valve,                            appendiceal orifice, and rectum were photographed. Scope In: 11:11:39 AM Scope Out: 11:29:51 AM Scope Withdrawal Time: 0 hours 13 minutes 12 seconds  Total Procedure Duration: 0 hours 18 minutes 12 seconds  Findings:                 The perianal and digital rectal examinations were                            normal.                           Four sessile polyps were found in the ascending                            colon and cecum. The polyps were 4 to 8 mm in size.                            These polyps were removed with a cold snare.                            Resection and retrieval were complete.                           Non-bleeding external and internal hemorrhoids were  found during retroflexion. The hemorrhoids were                            medium-sized. Complications:            No immediate complications. Estimated Blood Loss:     Estimated blood loss was minimal. Impression:               - Four 4 to 8 mm polyps in the ascending colon and                            in the cecum, removed with a cold snare. Resected                            and retrieved.                           - Non-bleeding external and internal hemorrhoids. Recommendation:           - Patient has a contact number available for                            emergencies. The signs and symptoms of potential                            delayed complications were discussed with the                            patient. Return to normal activities tomorrow.                            Written discharge instructions were provided to the                             patient.                           - Resume previous diet.                           - Continue present medications.                           - Await pathology results.                           - Repeat colonoscopy in 3 years for surveillance                            based on pathology results and for surveillance of                            multiple polyps.                           - Schedule an upper GI endoscopy at the next  available appointment for further evaluation of                            significant unintentional weight loss and no                            obvious source for positive FIT on colonoscopy. Napoleon Form, MD 07/29/2022 11:45:12 AM This report has been signed electronically.

## 2022-07-30 ENCOUNTER — Telehealth: Payer: Self-pay | Admitting: *Deleted

## 2022-07-30 NOTE — Telephone Encounter (Signed)
  Follow up Call-     07/29/2022   10:30 AM 07/24/2022    7:53 AM  Call back number  Post procedure Call Back phone  # [phone]  Permission to leave phone message Yes Yes     Patient questions:  Do you have a fever, pain , or abdominal swelling? No. Pain Score  0 *  Have you tolerated food without any problems? Yes.    Have you been able to return to your normal activities? Yes.    Do you have any questions about your discharge instructions: Diet   No. Medications  No. Follow up visit  No.  Do you have questions or concerns about your Care? No.  Actions: * If pain score is 4 or above: No action needed, pain <4.

## 2022-08-02 ENCOUNTER — Encounter: Payer: Self-pay | Admitting: Gastroenterology

## 2022-09-02 ENCOUNTER — Encounter: Payer: Self-pay | Admitting: Gastroenterology

## 2022-09-17 ENCOUNTER — Ambulatory Visit: Payer: Commercial Managed Care - HMO

## 2022-09-17 VITALS — Ht 62.0 in | Wt 150.0 lb

## 2022-09-17 DIAGNOSIS — R634 Abnormal weight loss: Secondary | ICD-10-CM

## 2022-09-17 NOTE — Progress Notes (Signed)

## 2022-09-22 ENCOUNTER — Encounter: Payer: Self-pay | Admitting: Gastroenterology

## 2022-09-30 ENCOUNTER — Telehealth: Payer: Self-pay | Admitting: Gastroenterology

## 2022-09-30 NOTE — Telephone Encounter (Signed)
Returned call to patient, she did not know what medication she needed. Explained to patient that there is no medication prescribed prior to her EGD. Reviewed prep instruction with her and she verbalized understanding.

## 2022-09-30 NOTE — Telephone Encounter (Signed)
Inbound call from patient stating she needs prep medication for tomorrow's endoscopy 7/24 sent to [facility] [address] in [place], [place]. Please advise, thank you.

## 2022-10-01 ENCOUNTER — Ambulatory Visit: Payer: Commercial Managed Care - HMO | Admitting: Gastroenterology

## 2022-10-01 ENCOUNTER — Other Ambulatory Visit: Payer: Commercial Managed Care - HMO | Admitting: Gastroenterology

## 2022-10-01 ENCOUNTER — Encounter: Payer: Self-pay | Admitting: Gastroenterology

## 2022-10-01 VITALS — BP 113/57 | HR 60 | Temp 98.7°F | Resp 16 | Wt 150.0 lb

## 2022-10-01 DIAGNOSIS — K2950 Unspecified chronic gastritis without bleeding: Secondary | ICD-10-CM | POA: Diagnosis not present

## 2022-10-01 DIAGNOSIS — R634 Abnormal weight loss: Secondary | ICD-10-CM

## 2022-10-01 DIAGNOSIS — B9681 Helicobacter pylori [H. pylori] as the cause of diseases classified elsewhere: Secondary | ICD-10-CM | POA: Diagnosis not present

## 2022-10-01 DIAGNOSIS — K2900 Acute gastritis without bleeding: Secondary | ICD-10-CM | POA: Diagnosis present

## 2022-10-01 DIAGNOSIS — R195 Other fecal abnormalities: Secondary | ICD-10-CM

## 2022-10-01 MED ORDER — SODIUM CHLORIDE 0.9 % IV SOLN
500.0000 mL | Freq: Once | INTRAVENOUS | Status: DC
Start: 2022-10-01 — End: 2022-10-01

## 2022-10-01 NOTE — Progress Notes (Unsigned)
Report to PACU, RN, vss, BBS= Clear.  

## 2022-10-01 NOTE — Progress Notes (Signed)
Called to room to assist during endoscopic procedure.  Patient ID and intended procedure confirmed with present staff. Received instructions for my participation in the procedure from the performing physician.  

## 2022-10-01 NOTE — Patient Instructions (Signed)
-  handout on gastritis provided  -small frequent meals with high protein diet  YOU HAD AN ENDOSCOPIC PROCEDURE TODAY AT THE [facility]:   Refer to the procedure report that was given to you for any specific questions about what was found during the examination.  If the procedure report does not answer your questions, please call your gastroenterologist to clarify.  If you requested that your care partner not be given the details of your procedure findings, then the procedure report has been included in a sealed envelope for you to review at your convenience later.  YOU SHOULD EXPECT: Some feelings of bloating in the abdomen. Passage of more gas than usual.  Walking can help get rid of the air that was put into your GI tract during the procedure and reduce the bloating. If you had a lower endoscopy (such as a colonoscopy or flexible sigmoidoscopy) you may notice spotting of blood in your stool or on the toilet paper. If you underwent a bowel prep for your procedure, you may not have a normal bowel movement for a few days.  Please Note:  You might notice some irritation and congestion in your nose or some drainage.  This is from the oxygen used during your procedure.  There is no need for concern and it should clear up in a day or so.  SYMPTOMS TO REPORT IMMEDIATELY:  Following upper endoscopy (EGD)  Vomiting of blood or coffee ground material  New chest pain or pain under the shoulder blades  Painful or persistently difficult swallowing  New shortness of breath  Fever of 100F or higher  Black, tarry-looking stools  For urgent or emergent issues, a gastroenterologist can be reached at any hour by calling [phone]. Do not use MyChart messaging for urgent concerns.    DIET:  We do recommend a small meal at first, but then you may proceed to your regular diet.  Drink plenty of fluids but you should avoid alcoholic beverages for 24 hours.-small frequent meals with high protein  diet  ACTIVITY:  You should plan to take it easy for the rest of today and you should NOT DRIVE or use heavy machinery until tomorrow (because of the sedation medicines used during the test).    FOLLOW UP: Our staff will call the number listed on your records the next business day following your procedure.  We will call around 7:15- 8:00 am to check on you and address any questions or concerns that you may have regarding the information given to you following your procedure. If we do not reach you, we will leave a message.     If any biopsies were taken you will be contacted by phone or by letter within the next 1-3 weeks.  Please call us at [phone] if you have not heard about the biopsies in 3 weeks.    SIGNATURES/CONFIDENTIALITY: You and/or your care partner have signed paperwork which will be entered into your electronic medical record.  These signatures attest to the fact that that the information above on your After Visit Summary has been reviewed and is understood.  Full responsibility of the confidentiality of this discharge information lies with you and/or your care-partner.

## 2022-10-01 NOTE — Op Note (Signed)
[facility] Patient Name: Taylor Cook Procedure Date: 10/01/2022 3:37 PM MRN: [id number] Endoscopist: Napoleon Form , MD, [id number] Age: 57 Referring MD:  Date of Birth: 1965-08-16 Gender: Female Account #: [id number] Procedure:                Upper GI endoscopy Indications:              Anorexia, Failure to thrive, Weight loss Medicines:                Monitored Anesthesia Care Procedure:                Pre-Anesthesia Assessment:                           - Prior to the procedure, a History and Physical                            was performed, and patient medications and                            allergies were reviewed. The patient's tolerance of                            previous anesthesia was also reviewed. The risks                            and benefits of the procedure and the sedation                            options and risks were discussed with the patient.                            All questions were answered, and informed consent                            was obtained. Prior Anticoagulants: The patient has                            taken no anticoagulant or antiplatelet agents. ASA                            Grade Assessment: II - A patient with mild systemic                            disease. After reviewing the risks and benefits,                            the patient was deemed in satisfactory condition to                            undergo the procedure.                           After obtaining informed consent, the endoscope was  passed under direct vision. Throughout the                            procedure, the patient's blood pressure, pulse, and                            oxygen saturations were monitored continuously. The                            GIF [id number] #[id number] was introduced through the                            mouth, and advanced to the second part of duodenum.                            The upper  GI endoscopy was accomplished without                            difficulty. The patient tolerated the procedure                            well. Scope In: Scope Out: Findings:                 The Z-line was regular and was found 40 cm from the                            incisors.                           The examined esophagus was normal.                           Patchy mild inflammation characterized by                            congestion (edema), erythema and friability was                            found in the entire examined stomach. Biopsies were                            taken with a cold forceps for histology. Biopsies                            were taken with a cold forceps for Helicobacter                            pylori testing.                           The cardia and gastric fundus were normal on                            retroflexion.  The examined duodenum was normal. Complications:            No immediate complications. Estimated Blood Loss:     Estimated blood loss was minimal. Impression:               - Z-line regular, 40 cm from the incisors.                           - Normal esophagus.                           - Gastritis. Biopsied.                           - Normal examined duodenum. Recommendation:           - Patient has a contact number available for                            emergencies. The signs and symptoms of potential                            delayed complications were discussed with the                            patient. Return to normal activities tomorrow.                            Written discharge instructions were provided to the                            patient.                           - Resume previous diet.                           - Continue present medications.                           - Await pathology results.                           - Small frequent meals with highprotein diet Napoleon Form, MD 10/01/2022 3:58:48 PM This report has been signed electronically.

## 2022-10-01 NOTE — Progress Notes (Unsigned)
Vitals-DT  Pt's states no medical or surgical changes since previsit or office visit.   Pt's states no medical or surgical changes since previsit or office visit.

## 2022-10-01 NOTE — Progress Notes (Unsigned)
[facility] History and Physical   Primary Care Physician:  Marcine Matar, MD   Reason for Procedure:  Significant weight loss, positive FIT  Plan:    EGD  with possible interventions as needed     HPI: Taylor Cook is a very pleasant 57 y.o. female here for EGD for evaluation of significant weight loss, positive FIT with unremarkable colonoscopy.   The risks and benefits as well as alternatives of endoscopic procedure(s) have been discussed and reviewed. All questions answered. The patient agrees to proceed.    Past Medical History:  Diagnosis Date   Anxiety    Arthritis    Asthma    Bronchitis    Depression    Hyperlipidemia    Hypertension    Seasonal allergies     Past Surgical History:  Procedure Laterality Date   BREAST CYST EXCISION Left age 17   BREAST SURGERY     removal of 2 cyst   TUBAL LIGATION      Prior to Admission medications   Medication Sig Start Date End Date Taking? Authorizing Provider  albuterol (VENTOLIN HFA) 108 (90 Base) MCG/ACT inhaler Inhale 2 puffs into the lungs every 6 (six) hours as needed for wheezing or shortness of breath. 06/12/22  Yes Marcine Matar, MD  amLODipine (NORVASC) 5 MG tablet Take 1 tablet (5 mg total) by mouth daily. 06/12/22  Yes Marcine Matar, MD  atorvastatin (LIPITOR) 10 MG tablet Take 1 tablet (10 mg total) by mouth daily. 06/12/22  Yes Marcine Matar, MD  hydrochlorothiazide (HYDRODIURIL) 25 MG tablet Take 1 tablet (25 mg total) by mouth daily. 06/12/22  Yes Marcine Matar, MD  ondansetron (ZOFRAN-ODT) 4 MG disintegrating tablet Take 1 tablet (4 mg total) by mouth 2 (two) times daily as needed for nausea or vomiting. 12/31/21  Yes Marcine Matar, MD  sertraline (ZOLOFT) 50 MG tablet Take 1 tablet (50 mg total) by mouth daily. 06/12/22  Yes Marcine Matar, MD  gabapentin (NEURONTIN) 600 MG tablet Take 0.5 tablets (300 mg total) by mouth at bedtime. 06/12/22   Marcine Matar, MD   metoprolol tartrate (LOPRESSOR) 100 MG tablet Take 1 tablet 2 hours prior to your Cardiac CT 09/05/18 08/22/19  Beatriz Stallion., PA-C    Current Outpatient Medications  Medication Sig Dispense Refill   albuterol (VENTOLIN HFA) 108 (90 Base) MCG/ACT inhaler Inhale 2 puffs into the lungs every 6 (six) hours as needed for wheezing or shortness of breath. 6.7 g 2   amLODipine (NORVASC) 5 MG tablet Take 1 tablet (5 mg total) by mouth daily. 90 tablet 1   atorvastatin (LIPITOR) 10 MG tablet Take 1 tablet (10 mg total) by mouth daily. 90 tablet 1   hydrochlorothiazide (HYDRODIURIL) 25 MG tablet Take 1 tablet (25 mg total) by mouth daily. 90 tablet 3   ondansetron (ZOFRAN-ODT) 4 MG disintegrating tablet Take 1 tablet (4 mg total) by mouth 2 (two) times daily as needed for nausea or vomiting. 20 tablet 0   sertraline (ZOLOFT) 50 MG tablet Take 1 tablet (50 mg total) by mouth daily. 90 tablet 1   gabapentin (NEURONTIN) 600 MG tablet Take 0.5 tablets (300 mg total) by mouth at bedtime. 45 tablet 1   Current Facility-Administered Medications  Medication Dose Route Frequency Provider Last Rate Last Admin   0.9 %  sodium chloride infusion  500 mL Intravenous Once Napoleon Form, MD        Allergies as of 10/01/2022 -  Review Complete 10/01/2022  Allergen Reaction Noted   Pineapple Swelling 09/17/2022    Family History  Problem Relation Age of Onset   Colon polyps Father    Stroke Other        fx   Heart attack Other        fx   Heart disease Other        fx   Hypertension Other        fx   Cancer Other        fx   Breast cancer Neg Hx    Colon cancer Neg Hx    Crohn's disease Neg Hx    Esophageal cancer Neg Hx    Rectal cancer Neg Hx    Stomach cancer Neg Hx    Ulcerative colitis Neg Hx     Social History   Socioeconomic History   Marital status: Married    Spouse name: Not on file   Number of children: Not on file   Years of education: Not on file   Highest education  level: Not on file  Occupational History   Not on file  Tobacco Use   Smoking status: Some Days    Current packs/day: 0.00    Types: Cigarettes    Last attempt to quit: 10/12/2017    Years since quitting: 4.9   Smokeless tobacco: Never   Tobacco comments:    3 cigs/day  Vaping Use   Vaping status: Never Used  Substance and Sexual Activity   Alcohol use: No    Alcohol/week: 0.0 standard drinks of alcohol   Drug use: No   Sexual activity: Not Currently  Other Topics Concern   Not on file  Social History Narrative   Not on file   Social Determinants of Health   Financial Resource Strain: Not on file  Food Insecurity: Not on file  Transportation Needs: Not on file  Physical Activity: Not on file  Stress: Not on file  Social Connections: Unknown (07/23/2021)   Received from [facility]   Social Network    Social Network: Not on file  Intimate Partner Violence: Unknown (06/14/2021)   Received from [facility]   HITS    Physically Hurt: Not on file    Insult or Talk Down To: Not on file    Threaten Physical Harm: Not on file    Scream or Curse: Not on file    Review of Systems:  All other review of systems negative except as mentioned in the HPI.  Physical Exam: Vital signs in last 24 hours: BP (!) 145/71 (BP Location: Right Arm, Patient Position: Sitting, Cuff Size: Normal)   Pulse 69   Temp 98.7 F (37.1 C) (Temporal)   Wt 150 lb (68 kg)   LMP 06/04/2018 (Exact Date)   SpO2 99%   BMI 27.44 kg/m  General:   Alert, NAD Lungs:  Clear .   Heart:  Regular rate and rhythm Abdomen:  Soft, nontender and nondistended. Neuro/Psych:  Alert and cooperative. Normal mood and affect. A and O x 3  Reviewed labs, radiology imaging, old records and pertinent past GI work up  Patient is appropriate for planned procedure(s) and anesthesia in an ambulatory setting   K. Scherry Ran , MD [phone]

## 2022-10-02 ENCOUNTER — Telehealth: Payer: Self-pay | Admitting: *Deleted

## 2022-10-02 ENCOUNTER — Encounter: Payer: Self-pay | Admitting: Gastroenterology

## 2022-10-02 NOTE — Telephone Encounter (Signed)
  Follow up Call-     10/01/2022    3:09 PM 10/01/2022    3:05 PM 07/29/2022   10:30 AM 07/24/2022    7:53 AM  Call back number  Post procedure Call Back phone  # [phone]  [phone]  Permission to leave phone message  Yes Yes Yes     Patient questions:  Do you have a fever, pain , or abdominal swelling? No. Pain Score  0 *  Have you tolerated food without any problems? Yes.    Have you been able to return to your normal activities? Yes.    Do you have any questions about your discharge instructions: Diet   No. Medications  No. Follow up visit  No.  Do you have questions or concerns about your Care? No.  Actions: * If pain score is 4 or above: No action needed, pain <4.

## 2022-10-14 ENCOUNTER — Other Ambulatory Visit: Payer: Self-pay

## 2022-10-14 MED ORDER — METRONIDAZOLE 250 MG PO TABS: 250.0000 mg | ORAL_TABLET | Freq: Four times a day (QID) | ORAL | 0 refills | Status: AC

## 2022-10-14 MED ORDER — OMEPRAZOLE 40 MG PO CPDR: 40.0000 mg | DELAYED_RELEASE_CAPSULE | Freq: Two times a day (BID) | ORAL | 0 refills | Status: AC

## 2022-10-14 MED ORDER — DOXYCYCLINE HYCLATE 100 MG PO CAPS: 100.0000 mg | ORAL_CAPSULE | Freq: Two times a day (BID) | ORAL | 0 refills | Status: AC

## 2022-10-14 MED ORDER — BISMUTH SUBSALICYLATE 262 MG PO TABS: 2.0000 | ORAL_TABLET | Freq: Four times a day (QID) | ORAL | 0 refills | Status: AC

## 2022-11-14 NOTE — Progress Notes (Signed)
This encounter was created in error - please disregard.

## 2022-12-26 ENCOUNTER — Telehealth: Payer: Self-pay

## 2022-12-26 NOTE — Telephone Encounter (Signed)
Called the patient to advise due for labs. Phone rings and then goes to busy. No voicemail.

## 2022-12-26 NOTE — Telephone Encounter (Signed)
-----   Message from Nurse Patty P sent at 12/12/2022  8:50 AM EDT ----- Regarding: FW: h pylori eradication  ----- Message ----- From: Loretha Stapler, RN Sent: 12/11/2022  12:00 AM EDT To: Evalee Jefferson, LPN Subject: FW: h pylori eradication                        ----- Message ----- From: Evalee Jefferson, LPN Sent: 7/82/9562  12:00 AM EDT To: Evalee Jefferson, LPN Subject: h pylori eradication                           Call the patient about off PPI and retesting.  Supposed to have started her therapy on 10/15/22

## 2022-12-30 ENCOUNTER — Other Ambulatory Visit: Payer: Self-pay

## 2022-12-30 DIAGNOSIS — A048 Other specified bacterial intestinal infections: Secondary | ICD-10-CM

## 2023-01-06 ENCOUNTER — Other Ambulatory Visit: Payer: Self-pay

## 2023-01-06 NOTE — Telephone Encounter (Signed)
Letter mailed to the patient
